# Patient Record
Sex: Female | Born: 1945
Health system: Southern US, Community
[De-identification: ages and names within clinical notes are randomized; demographics above are authoritative.]

## PROBLEM LIST (undated history)

## (undated) DIAGNOSIS — E119 Type 2 diabetes mellitus without complications: Secondary | ICD-10-CM

## (undated) DIAGNOSIS — R569 Unspecified convulsions: Secondary | ICD-10-CM

## (undated) DIAGNOSIS — F419 Anxiety disorder, unspecified: Secondary | ICD-10-CM

## (undated) DIAGNOSIS — R251 Tremor, unspecified: Secondary | ICD-10-CM

## (undated) DIAGNOSIS — F319 Bipolar disorder, unspecified: Secondary | ICD-10-CM

## (undated) DIAGNOSIS — I10 Essential (primary) hypertension: Secondary | ICD-10-CM

## (undated) DIAGNOSIS — E7800 Pure hypercholesterolemia, unspecified: Secondary | ICD-10-CM

## (undated) HISTORY — DX: Unspecified convulsions: R56.9

## (undated) HISTORY — DX: Essential (primary) hypertension: I10

## (undated) HISTORY — DX: Anxiety disorder, unspecified: F41.9

## (undated) HISTORY — DX: Tremor, unspecified: R25.1

## (undated) HISTORY — DX: Type 2 diabetes mellitus without complications: E11.9

## (undated) HISTORY — DX: Bipolar disorder, unspecified: F31.9

## (undated) HISTORY — DX: Pure hypercholesterolemia, unspecified: E78.00

---

## 2005-03-06 ENCOUNTER — Ambulatory Visit: Payer: Self-pay | Admitting: Physical Medicine & Rehabilitation

## 2005-03-06 ENCOUNTER — Inpatient Hospital Stay (HOSPITAL_COMMUNITY): Admission: RE | Admit: 2005-03-06 | Discharge: 2005-03-14 | Payer: Self-pay | Admitting: Neurosurgery

## 2005-12-10 IMAGING — CR DG CHEST 2V
2 series · 2 of 2 positions shown · non-contrast
Comparison: None.

CLINICAL DATA: 59 year old with meningioma.  History of seizures.  Productive cough.
 CHEST - 2 VIEWS:

[view not recorded (1 of 2)]
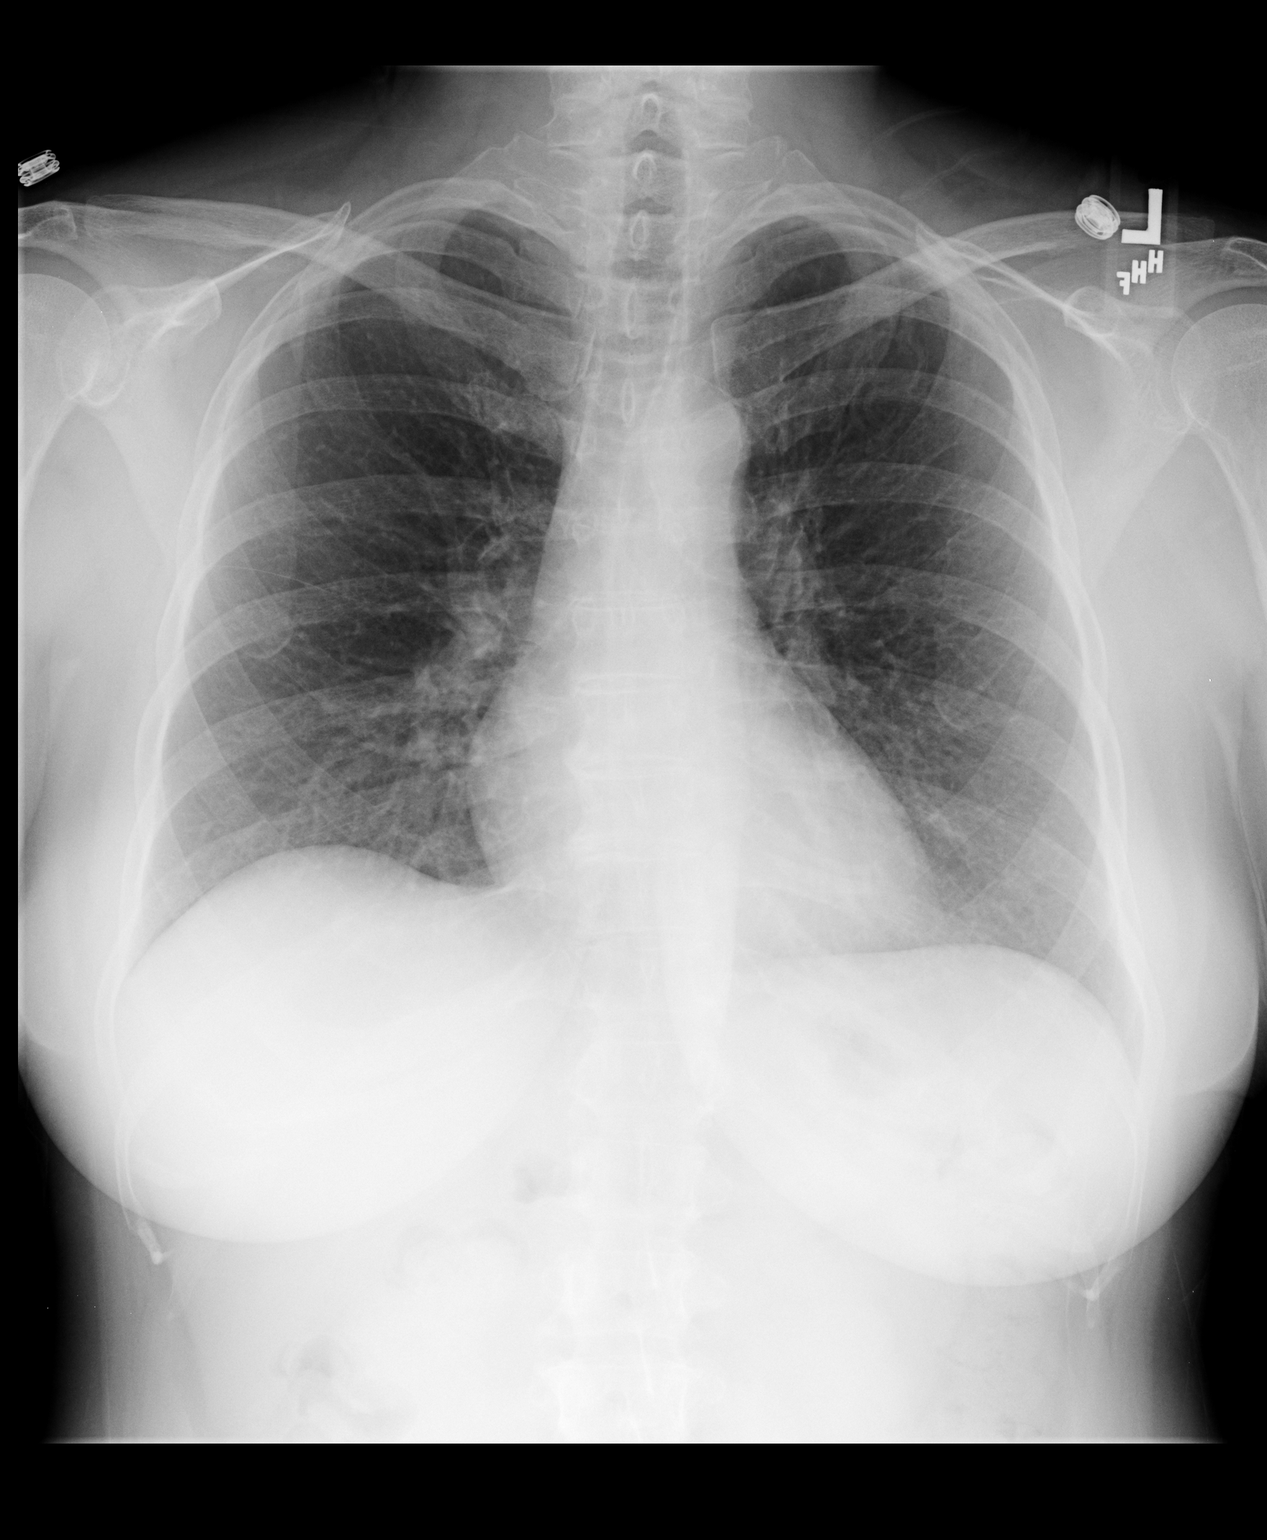

[view not recorded (2 of 2)]
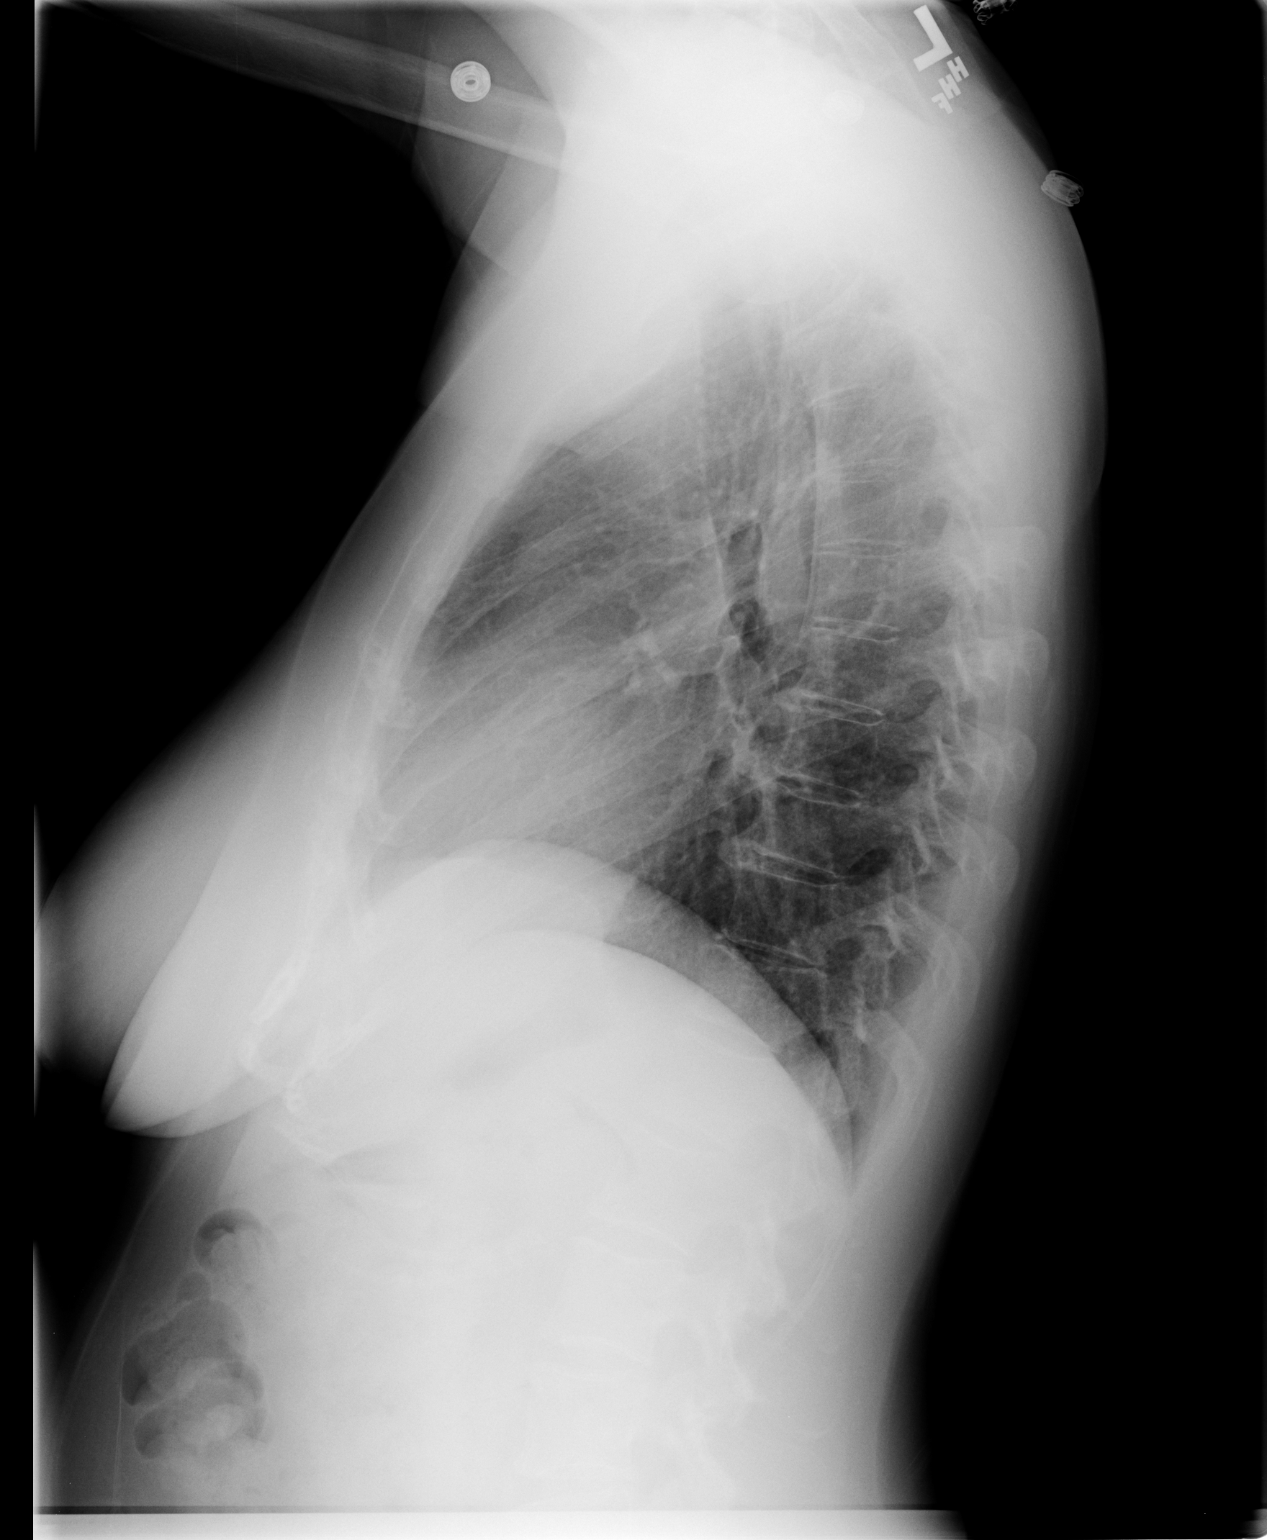

[2 of 2 positions shown; findings below may reference images not displayed]

FINDINGS: The heart is normal in size.  The lungs are free of focal consolidations and pleural effusions.  There is mild prominence of the interstitial markings, a nonspecific finding.  No evidence for pulmonary edema however.
IMPRESSION: Minimal prominence of interstitial markings.

## 2005-12-14 IMAGING — CT CT HEAD W/O CM
1 of 2 series · 13 of 30 positions shown, 17 images · IV contrast (agent unspecified)
Comparison: None.

CLINICAL DATA: Meningioma.
 HEAD CT WITHOUT CONTRAST:
TECHNIQUE: Contiguous axial images were obtained from the base of the skull through the vertex according to standard protocol without contrast.

[Series 2: brain · axial · 0.49mm/px · z∈[+126,+246]mm · 13 of 28 slices shown, 17 images]
[im 2/28  brain]
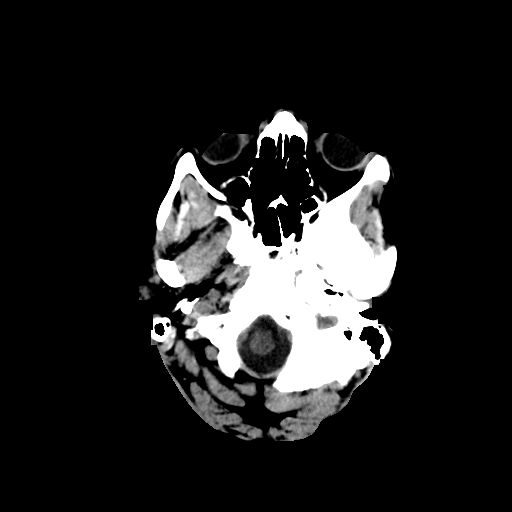
[im 2/28  bone]
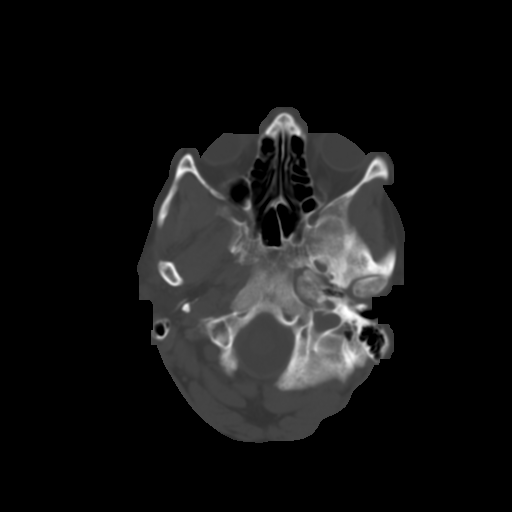
[im 4/28  brain]
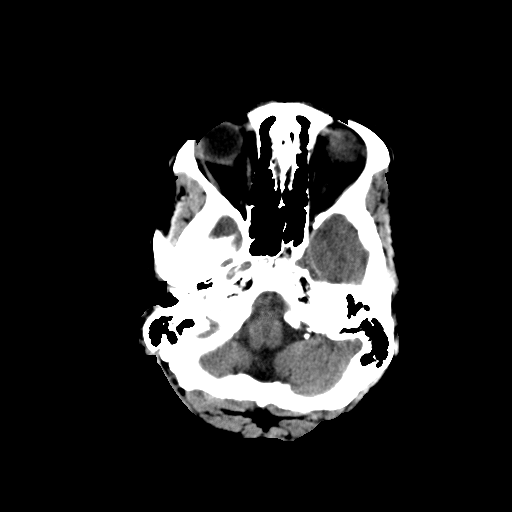
[im 6/28  brain]
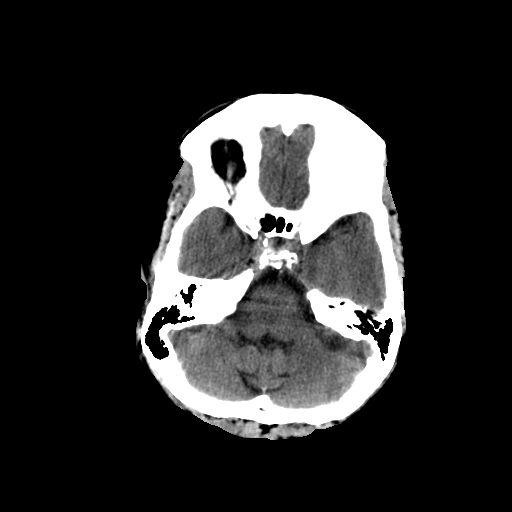
[im 8/28  brain]
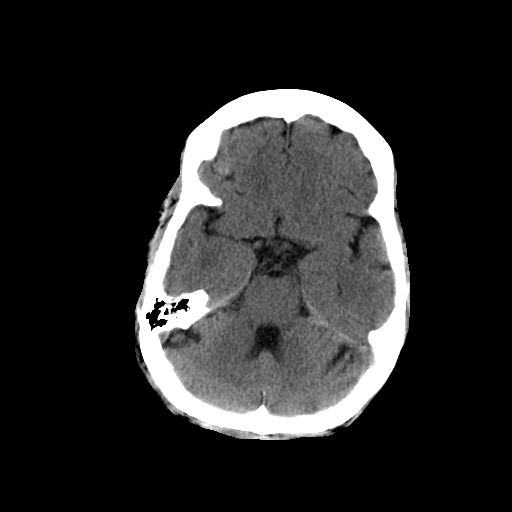
[im 10/28  brain]
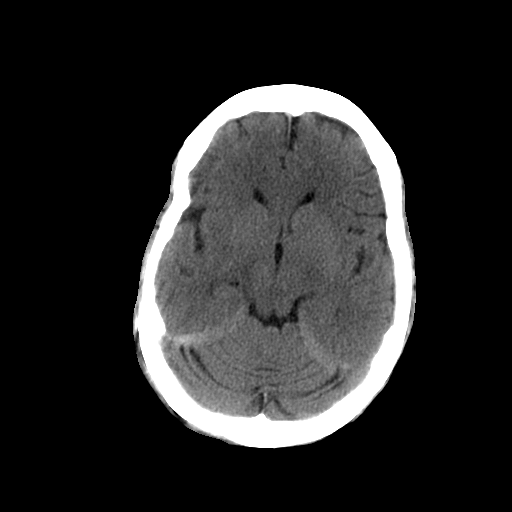
[im 10/28  bone]
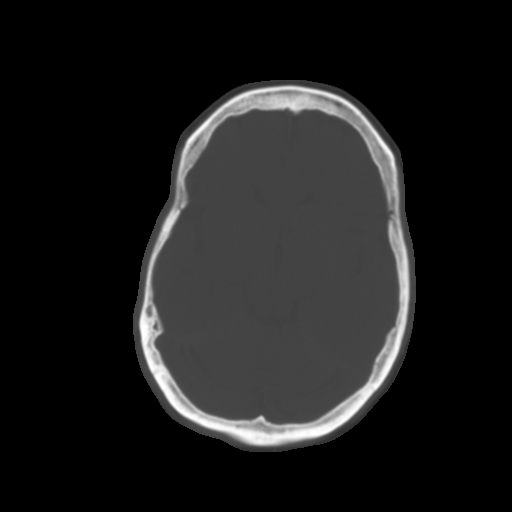
[im 12/28  brain]
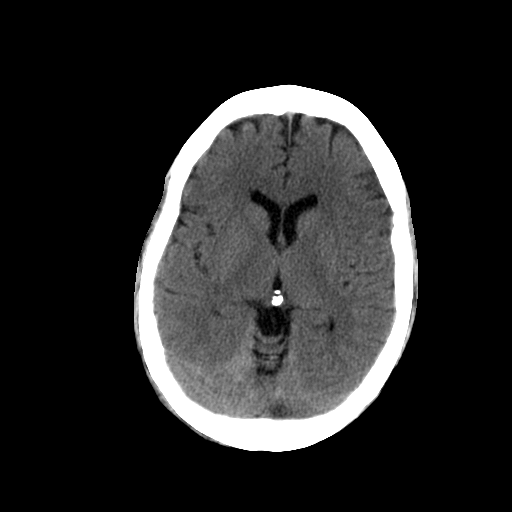
[im 14/28  brain]
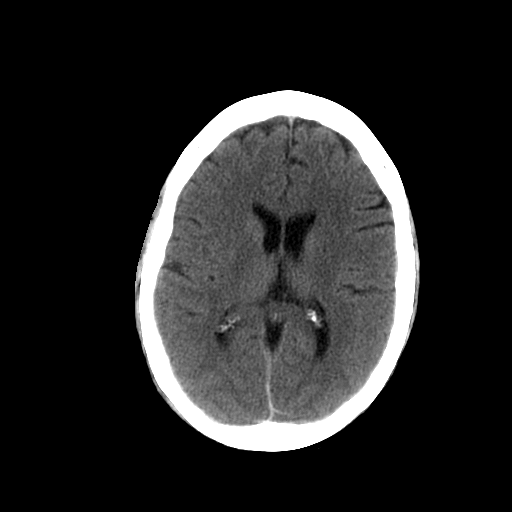
[im 16/28  brain]
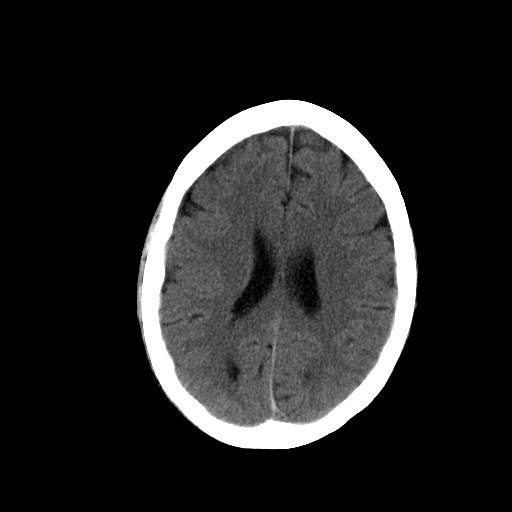
[im 18/28  brain]
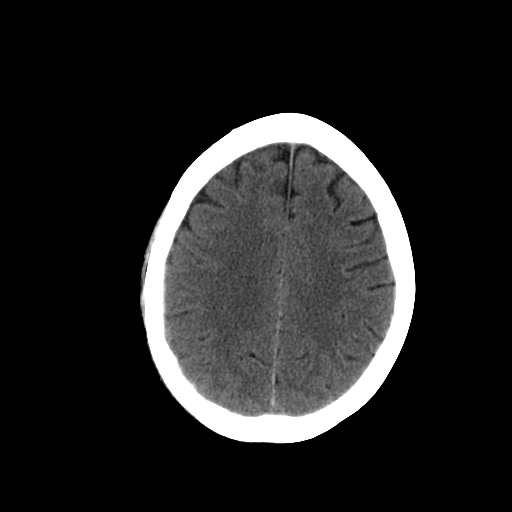
[im 18/28  bone]
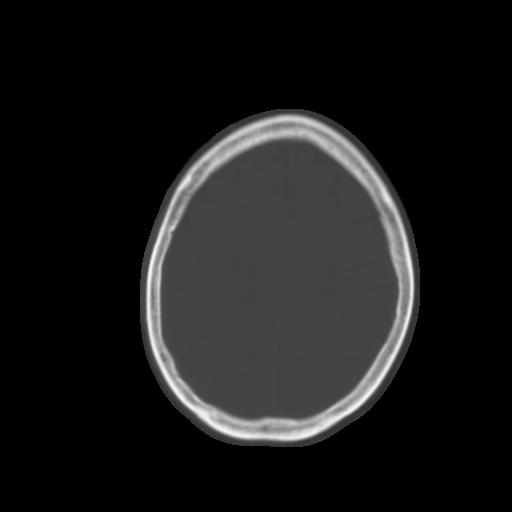
[im 20/28  brain]
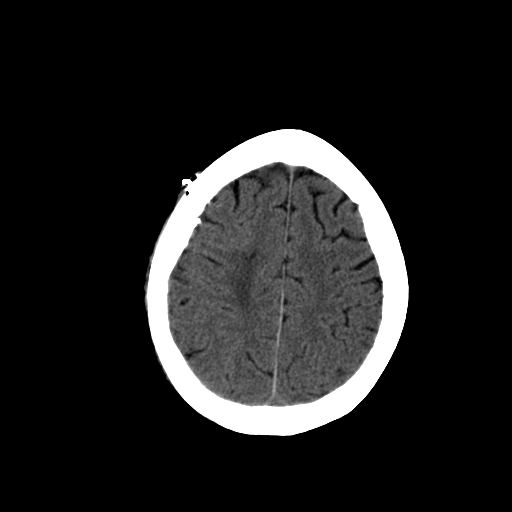
[im 22/28  brain]
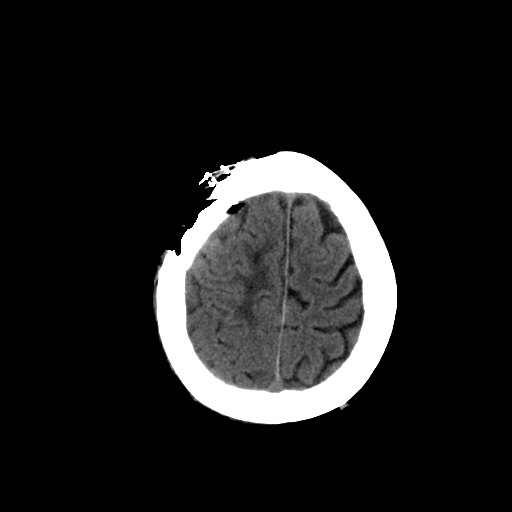
[im 24/28  brain]
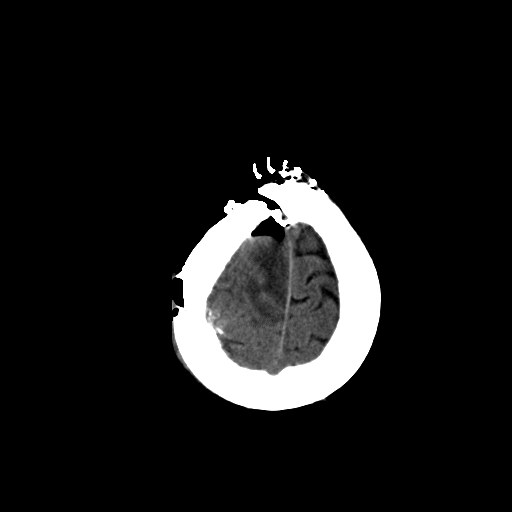
[im 26/28  brain]
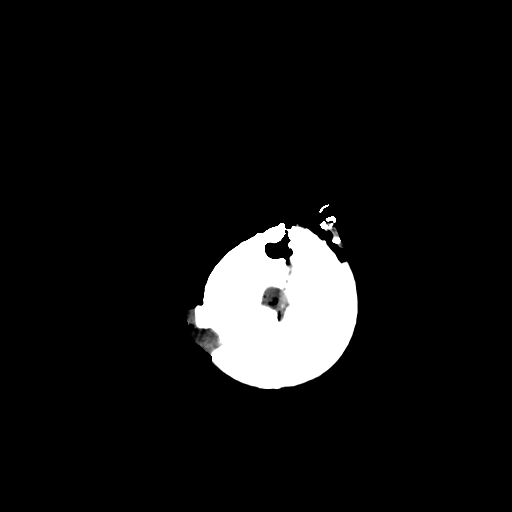
[im 26/28  bone]
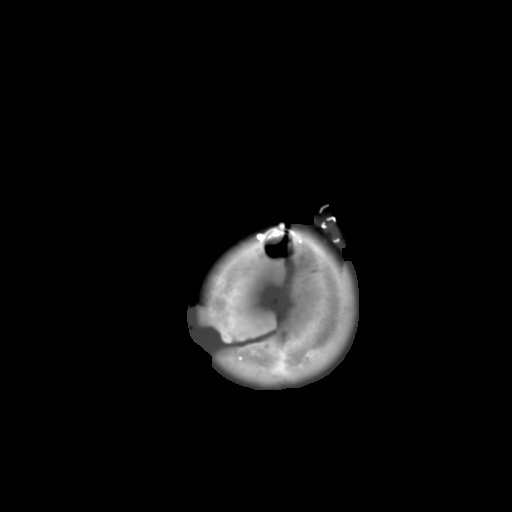

[13 of 30 positions shown; findings below may reference images not displayed]

FINDINGS: A high right frontoparietal craniotomy is noted.   A small amount of gas is present in the extraaxial space overlying the high right frontal lobe.   A small amount of vasogenic edema is present in the high right frontal lobe.   There is no evidence of hemorrhage in the surgical bed.   A small amount of hemorrhage may be present layering on the right side of the tentorium.   The ventricular system is within normal limits.   The mastoid air cells and visualized paranasal sinuses are clear.
IMPRESSION: Right frontoparietal craniotomy with a small amount of vasogenic edema in the adjacent right frontal lobe.   Small amount of blood layering on the right side of the tentorium is suspected.

## 2006-02-06 ENCOUNTER — Encounter
Admission: RE | Admit: 2006-02-06 | Discharge: 2006-05-07 | Payer: Self-pay | Admitting: Physical Medicine & Rehabilitation

## 2006-02-06 ENCOUNTER — Ambulatory Visit: Payer: Self-pay | Admitting: Physical Medicine & Rehabilitation

## 2011-08-13 DIAGNOSIS — J301 Allergic rhinitis due to pollen: Secondary | ICD-10-CM | POA: Diagnosis not present

## 2011-11-11 DIAGNOSIS — C801 Malignant (primary) neoplasm, unspecified: Secondary | ICD-10-CM | POA: Diagnosis not present

## 2011-11-11 DIAGNOSIS — C713 Malignant neoplasm of parietal lobe: Secondary | ICD-10-CM | POA: Diagnosis not present

## 2011-11-11 DIAGNOSIS — C700 Malignant neoplasm of cerebral meninges: Secondary | ICD-10-CM | POA: Diagnosis not present

## 2011-11-11 DIAGNOSIS — D320 Benign neoplasm of cerebral meninges: Secondary | ICD-10-CM | POA: Diagnosis not present

## 2011-11-11 DIAGNOSIS — C7949 Secondary malignant neoplasm of other parts of nervous system: Secondary | ICD-10-CM | POA: Diagnosis not present

## 2011-11-11 DIAGNOSIS — C7931 Secondary malignant neoplasm of brain: Secondary | ICD-10-CM | POA: Diagnosis not present

## 2011-12-18 DIAGNOSIS — H6980 Other specified disorders of Eustachian tube, unspecified ear: Secondary | ICD-10-CM | POA: Diagnosis not present

## 2011-12-18 DIAGNOSIS — F411 Generalized anxiety disorder: Secondary | ICD-10-CM | POA: Diagnosis not present

## 2011-12-18 DIAGNOSIS — I10 Essential (primary) hypertension: Secondary | ICD-10-CM | POA: Diagnosis not present

## 2011-12-18 DIAGNOSIS — E782 Mixed hyperlipidemia: Secondary | ICD-10-CM | POA: Diagnosis not present

## 2011-12-25 DIAGNOSIS — I10 Essential (primary) hypertension: Secondary | ICD-10-CM | POA: Diagnosis not present

## 2011-12-25 DIAGNOSIS — E782 Mixed hyperlipidemia: Secondary | ICD-10-CM | POA: Diagnosis not present

## 2012-01-28 DIAGNOSIS — G40209 Localization-related (focal) (partial) symptomatic epilepsy and epileptic syndromes with complex partial seizures, not intractable, without status epilepticus: Secondary | ICD-10-CM | POA: Diagnosis not present

## 2012-01-28 DIAGNOSIS — D320 Benign neoplasm of cerebral meninges: Secondary | ICD-10-CM | POA: Diagnosis not present

## 2012-05-18 DIAGNOSIS — D320 Benign neoplasm of cerebral meninges: Secondary | ICD-10-CM | POA: Diagnosis not present

## 2012-05-18 DIAGNOSIS — Z09 Encounter for follow-up examination after completed treatment for conditions other than malignant neoplasm: Secondary | ICD-10-CM | POA: Diagnosis not present

## 2012-05-19 DIAGNOSIS — Z23 Encounter for immunization: Secondary | ICD-10-CM | POA: Diagnosis not present

## 2012-05-29 DIAGNOSIS — R209 Unspecified disturbances of skin sensation: Secondary | ICD-10-CM | POA: Diagnosis not present

## 2012-05-29 DIAGNOSIS — E782 Mixed hyperlipidemia: Secondary | ICD-10-CM | POA: Diagnosis not present

## 2012-05-30 DIAGNOSIS — S40019A Contusion of unspecified shoulder, initial encounter: Secondary | ICD-10-CM | POA: Diagnosis not present

## 2012-05-30 DIAGNOSIS — R296 Repeated falls: Secondary | ICD-10-CM | POA: Diagnosis not present

## 2012-06-11 DIAGNOSIS — C713 Malignant neoplasm of parietal lobe: Secondary | ICD-10-CM | POA: Diagnosis not present

## 2012-06-11 DIAGNOSIS — C700 Malignant neoplasm of cerebral meninges: Secondary | ICD-10-CM | POA: Diagnosis not present

## 2012-06-11 DIAGNOSIS — D320 Benign neoplasm of cerebral meninges: Secondary | ICD-10-CM | POA: Diagnosis not present

## 2012-06-11 DIAGNOSIS — Z79899 Other long term (current) drug therapy: Secondary | ICD-10-CM | POA: Diagnosis not present

## 2012-07-06 DIAGNOSIS — J209 Acute bronchitis, unspecified: Secondary | ICD-10-CM | POA: Diagnosis not present

## 2012-08-12 DIAGNOSIS — E782 Mixed hyperlipidemia: Secondary | ICD-10-CM | POA: Diagnosis not present

## 2012-08-12 DIAGNOSIS — M75100 Unspecified rotator cuff tear or rupture of unspecified shoulder, not specified as traumatic: Secondary | ICD-10-CM | POA: Diagnosis not present

## 2012-08-12 DIAGNOSIS — B354 Tinea corporis: Secondary | ICD-10-CM | POA: Diagnosis not present

## 2012-11-25 DIAGNOSIS — M25529 Pain in unspecified elbow: Secondary | ICD-10-CM | POA: Diagnosis not present

## 2012-11-25 DIAGNOSIS — M25569 Pain in unspecified knee: Secondary | ICD-10-CM | POA: Diagnosis not present

## 2012-11-30 DIAGNOSIS — M533 Sacrococcygeal disorders, not elsewhere classified: Secondary | ICD-10-CM | POA: Diagnosis not present

## 2012-11-30 DIAGNOSIS — M25529 Pain in unspecified elbow: Secondary | ICD-10-CM | POA: Diagnosis not present

## 2012-12-07 DIAGNOSIS — M533 Sacrococcygeal disorders, not elsewhere classified: Secondary | ICD-10-CM | POA: Diagnosis not present

## 2012-12-08 DIAGNOSIS — M7710 Lateral epicondylitis, unspecified elbow: Secondary | ICD-10-CM | POA: Diagnosis not present

## 2012-12-09 DIAGNOSIS — M533 Sacrococcygeal disorders, not elsewhere classified: Secondary | ICD-10-CM | POA: Diagnosis not present

## 2012-12-14 DIAGNOSIS — M533 Sacrococcygeal disorders, not elsewhere classified: Secondary | ICD-10-CM | POA: Diagnosis not present

## 2012-12-16 DIAGNOSIS — M533 Sacrococcygeal disorders, not elsewhere classified: Secondary | ICD-10-CM | POA: Diagnosis not present

## 2012-12-21 DIAGNOSIS — M533 Sacrococcygeal disorders, not elsewhere classified: Secondary | ICD-10-CM | POA: Diagnosis not present

## 2012-12-23 DIAGNOSIS — I10 Essential (primary) hypertension: Secondary | ICD-10-CM | POA: Diagnosis not present

## 2012-12-23 DIAGNOSIS — M533 Sacrococcygeal disorders, not elsewhere classified: Secondary | ICD-10-CM | POA: Diagnosis not present

## 2012-12-23 DIAGNOSIS — E782 Mixed hyperlipidemia: Secondary | ICD-10-CM | POA: Diagnosis not present

## 2012-12-29 DIAGNOSIS — D320 Benign neoplasm of cerebral meninges: Secondary | ICD-10-CM | POA: Diagnosis not present

## 2013-01-01 DIAGNOSIS — M533 Sacrococcygeal disorders, not elsewhere classified: Secondary | ICD-10-CM | POA: Diagnosis not present

## 2013-02-02 DIAGNOSIS — I10 Essential (primary) hypertension: Secondary | ICD-10-CM | POA: Diagnosis not present

## 2013-02-02 DIAGNOSIS — F329 Major depressive disorder, single episode, unspecified: Secondary | ICD-10-CM | POA: Diagnosis not present

## 2013-05-24 DIAGNOSIS — Z23 Encounter for immunization: Secondary | ICD-10-CM | POA: Diagnosis not present

## 2013-06-28 DIAGNOSIS — S0993XA Unspecified injury of face, initial encounter: Secondary | ICD-10-CM | POA: Diagnosis not present

## 2013-06-28 DIAGNOSIS — S0990XA Unspecified injury of head, initial encounter: Secondary | ICD-10-CM | POA: Diagnosis not present

## 2013-06-28 DIAGNOSIS — Z6827 Body mass index (BMI) 27.0-27.9, adult: Secondary | ICD-10-CM | POA: Diagnosis not present

## 2013-06-28 DIAGNOSIS — R55 Syncope and collapse: Secondary | ICD-10-CM | POA: Diagnosis not present

## 2013-06-28 DIAGNOSIS — S0083XA Contusion of other part of head, initial encounter: Secondary | ICD-10-CM | POA: Diagnosis not present

## 2013-06-28 DIAGNOSIS — Z1389 Encounter for screening for other disorder: Secondary | ICD-10-CM | POA: Diagnosis not present

## 2013-06-28 DIAGNOSIS — S0003XA Contusion of scalp, initial encounter: Secondary | ICD-10-CM | POA: Diagnosis not present

## 2013-06-29 DIAGNOSIS — R55 Syncope and collapse: Secondary | ICD-10-CM | POA: Diagnosis not present

## 2013-06-30 DIAGNOSIS — F3130 Bipolar disorder, current episode depressed, mild or moderate severity, unspecified: Secondary | ICD-10-CM | POA: Diagnosis not present

## 2013-06-30 DIAGNOSIS — R55 Syncope and collapse: Secondary | ICD-10-CM | POA: Diagnosis not present

## 2013-07-16 DIAGNOSIS — R55 Syncope and collapse: Secondary | ICD-10-CM | POA: Diagnosis not present

## 2013-07-21 DIAGNOSIS — Z23 Encounter for immunization: Secondary | ICD-10-CM | POA: Diagnosis not present

## 2013-07-21 DIAGNOSIS — I517 Cardiomegaly: Secondary | ICD-10-CM | POA: Diagnosis not present

## 2013-07-21 DIAGNOSIS — R55 Syncope and collapse: Secondary | ICD-10-CM | POA: Diagnosis not present

## 2013-07-21 DIAGNOSIS — F3130 Bipolar disorder, current episode depressed, mild or moderate severity, unspecified: Secondary | ICD-10-CM | POA: Diagnosis not present

## 2013-08-06 DIAGNOSIS — H524 Presbyopia: Secondary | ICD-10-CM | POA: Diagnosis not present

## 2013-08-06 DIAGNOSIS — H43819 Vitreous degeneration, unspecified eye: Secondary | ICD-10-CM | POA: Diagnosis not present

## 2013-08-06 DIAGNOSIS — H52229 Regular astigmatism, unspecified eye: Secondary | ICD-10-CM | POA: Diagnosis not present

## 2013-08-06 DIAGNOSIS — H5200 Hypermetropia, unspecified eye: Secondary | ICD-10-CM | POA: Diagnosis not present

## 2013-09-27 DIAGNOSIS — Z6827 Body mass index (BMI) 27.0-27.9, adult: Secondary | ICD-10-CM | POA: Diagnosis not present

## 2013-09-27 DIAGNOSIS — H6980 Other specified disorders of Eustachian tube, unspecified ear: Secondary | ICD-10-CM | POA: Diagnosis not present

## 2013-10-13 DIAGNOSIS — Z6828 Body mass index (BMI) 28.0-28.9, adult: Secondary | ICD-10-CM | POA: Diagnosis not present

## 2013-10-13 DIAGNOSIS — E782 Mixed hyperlipidemia: Secondary | ICD-10-CM | POA: Diagnosis not present

## 2013-10-13 DIAGNOSIS — I10 Essential (primary) hypertension: Secondary | ICD-10-CM | POA: Diagnosis not present

## 2013-10-26 DIAGNOSIS — Z09 Encounter for follow-up examination after completed treatment for conditions other than malignant neoplasm: Secondary | ICD-10-CM | POA: Diagnosis not present

## 2013-10-26 DIAGNOSIS — D320 Benign neoplasm of cerebral meninges: Secondary | ICD-10-CM | POA: Diagnosis not present

## 2013-11-01 DIAGNOSIS — R7301 Impaired fasting glucose: Secondary | ICD-10-CM | POA: Diagnosis not present

## 2013-11-15 DIAGNOSIS — J0110 Acute frontal sinusitis, unspecified: Secondary | ICD-10-CM | POA: Diagnosis not present

## 2013-11-15 DIAGNOSIS — Z6827 Body mass index (BMI) 27.0-27.9, adult: Secondary | ICD-10-CM | POA: Diagnosis not present

## 2013-11-15 DIAGNOSIS — E119 Type 2 diabetes mellitus without complications: Secondary | ICD-10-CM | POA: Diagnosis not present

## 2013-11-23 DIAGNOSIS — Z01818 Encounter for other preprocedural examination: Secondary | ICD-10-CM | POA: Diagnosis not present

## 2013-11-23 DIAGNOSIS — I10 Essential (primary) hypertension: Secondary | ICD-10-CM | POA: Diagnosis present

## 2013-11-23 DIAGNOSIS — Z531 Procedure and treatment not carried out because of patient's decision for reasons of belief and group pressure: Secondary | ICD-10-CM | POA: Diagnosis not present

## 2013-11-23 DIAGNOSIS — Z472 Encounter for removal of internal fixation device: Secondary | ICD-10-CM | POA: Diagnosis not present

## 2013-11-23 DIAGNOSIS — D320 Benign neoplasm of cerebral meninges: Secondary | ICD-10-CM | POA: Diagnosis not present

## 2013-11-23 DIAGNOSIS — M129 Arthropathy, unspecified: Secondary | ICD-10-CM | POA: Diagnosis present

## 2013-11-23 DIAGNOSIS — G9389 Other specified disorders of brain: Secondary | ICD-10-CM | POA: Diagnosis not present

## 2013-11-23 DIAGNOSIS — G936 Cerebral edema: Secondary | ICD-10-CM | POA: Diagnosis not present

## 2013-11-23 DIAGNOSIS — R9431 Abnormal electrocardiogram [ECG] [EKG]: Secondary | ICD-10-CM | POA: Diagnosis not present

## 2013-11-23 DIAGNOSIS — K219 Gastro-esophageal reflux disease without esophagitis: Secondary | ICD-10-CM | POA: Diagnosis present

## 2013-11-23 DIAGNOSIS — F411 Generalized anxiety disorder: Secondary | ICD-10-CM | POA: Diagnosis present

## 2013-11-23 DIAGNOSIS — E119 Type 2 diabetes mellitus without complications: Secondary | ICD-10-CM | POA: Diagnosis present

## 2013-11-23 DIAGNOSIS — Z86011 Personal history of benign neoplasm of the brain: Secondary | ICD-10-CM | POA: Diagnosis not present

## 2013-11-23 DIAGNOSIS — E7800 Pure hypercholesterolemia, unspecified: Secondary | ICD-10-CM | POA: Diagnosis present

## 2013-12-07 DIAGNOSIS — D320 Benign neoplasm of cerebral meninges: Secondary | ICD-10-CM | POA: Diagnosis not present

## 2013-12-07 DIAGNOSIS — Z9889 Other specified postprocedural states: Secondary | ICD-10-CM | POA: Diagnosis not present

## 2013-12-07 DIAGNOSIS — Z4802 Encounter for removal of sutures: Secondary | ICD-10-CM | POA: Diagnosis not present

## 2013-12-13 DIAGNOSIS — E119 Type 2 diabetes mellitus without complications: Secondary | ICD-10-CM | POA: Diagnosis not present

## 2013-12-20 DIAGNOSIS — Z1389 Encounter for screening for other disorder: Secondary | ICD-10-CM | POA: Diagnosis not present

## 2013-12-20 DIAGNOSIS — Z1331 Encounter for screening for depression: Secondary | ICD-10-CM | POA: Diagnosis not present

## 2013-12-20 DIAGNOSIS — Z0000 Encounter for general adult medical examination without abnormal findings: Secondary | ICD-10-CM | POA: Diagnosis not present

## 2013-12-20 DIAGNOSIS — Z139 Encounter for screening, unspecified: Secondary | ICD-10-CM | POA: Diagnosis not present

## 2014-01-10 DIAGNOSIS — D320 Benign neoplasm of cerebral meninges: Secondary | ICD-10-CM | POA: Diagnosis not present

## 2014-02-09 DIAGNOSIS — E782 Mixed hyperlipidemia: Secondary | ICD-10-CM | POA: Diagnosis not present

## 2014-02-09 DIAGNOSIS — B351 Tinea unguium: Secondary | ICD-10-CM | POA: Diagnosis not present

## 2014-02-09 DIAGNOSIS — E119 Type 2 diabetes mellitus without complications: Secondary | ICD-10-CM | POA: Diagnosis not present

## 2014-02-09 DIAGNOSIS — I10 Essential (primary) hypertension: Secondary | ICD-10-CM | POA: Diagnosis not present

## 2014-02-18 DIAGNOSIS — N182 Chronic kidney disease, stage 2 (mild): Secondary | ICD-10-CM | POA: Diagnosis not present

## 2014-02-18 DIAGNOSIS — I129 Hypertensive chronic kidney disease with stage 1 through stage 4 chronic kidney disease, or unspecified chronic kidney disease: Secondary | ICD-10-CM | POA: Diagnosis not present

## 2014-02-18 DIAGNOSIS — E119 Type 2 diabetes mellitus without complications: Secondary | ICD-10-CM | POA: Diagnosis not present

## 2014-02-18 DIAGNOSIS — E782 Mixed hyperlipidemia: Secondary | ICD-10-CM | POA: Diagnosis not present

## 2014-02-23 DIAGNOSIS — L600 Ingrowing nail: Secondary | ICD-10-CM | POA: Diagnosis not present

## 2014-02-23 DIAGNOSIS — B351 Tinea unguium: Secondary | ICD-10-CM | POA: Diagnosis not present

## 2014-05-19 DIAGNOSIS — I10 Essential (primary) hypertension: Secondary | ICD-10-CM | POA: Diagnosis not present

## 2014-05-19 DIAGNOSIS — M7711 Lateral epicondylitis, right elbow: Secondary | ICD-10-CM | POA: Diagnosis not present

## 2014-05-19 DIAGNOSIS — E785 Hyperlipidemia, unspecified: Secondary | ICD-10-CM | POA: Diagnosis not present

## 2014-05-19 DIAGNOSIS — Z23 Encounter for immunization: Secondary | ICD-10-CM | POA: Diagnosis not present

## 2014-05-30 DIAGNOSIS — L918 Other hypertrophic disorders of the skin: Secondary | ICD-10-CM | POA: Diagnosis not present

## 2014-06-13 DIAGNOSIS — D320 Benign neoplasm of cerebral meninges: Secondary | ICD-10-CM | POA: Diagnosis not present

## 2014-06-13 DIAGNOSIS — Z9889 Other specified postprocedural states: Secondary | ICD-10-CM | POA: Diagnosis not present

## 2014-06-20 DIAGNOSIS — F3181 Bipolar II disorder: Secondary | ICD-10-CM | POA: Diagnosis not present

## 2014-07-07 DIAGNOSIS — H903 Sensorineural hearing loss, bilateral: Secondary | ICD-10-CM | POA: Diagnosis not present

## 2014-07-07 DIAGNOSIS — H9190 Unspecified hearing loss, unspecified ear: Secondary | ICD-10-CM | POA: Diagnosis not present

## 2014-07-11 DIAGNOSIS — F3181 Bipolar II disorder: Secondary | ICD-10-CM | POA: Diagnosis not present

## 2014-07-20 DIAGNOSIS — H2513 Age-related nuclear cataract, bilateral: Secondary | ICD-10-CM | POA: Diagnosis not present

## 2014-07-20 DIAGNOSIS — E119 Type 2 diabetes mellitus without complications: Secondary | ICD-10-CM | POA: Diagnosis not present

## 2014-09-02 DIAGNOSIS — F3181 Bipolar II disorder: Secondary | ICD-10-CM | POA: Diagnosis not present

## 2014-09-21 DIAGNOSIS — F3181 Bipolar II disorder: Secondary | ICD-10-CM | POA: Diagnosis not present

## 2014-09-22 DIAGNOSIS — I129 Hypertensive chronic kidney disease with stage 1 through stage 4 chronic kidney disease, or unspecified chronic kidney disease: Secondary | ICD-10-CM | POA: Diagnosis not present

## 2014-09-22 DIAGNOSIS — E782 Mixed hyperlipidemia: Secondary | ICD-10-CM | POA: Diagnosis not present

## 2014-09-22 DIAGNOSIS — N182 Chronic kidney disease, stage 2 (mild): Secondary | ICD-10-CM | POA: Diagnosis not present

## 2014-09-29 DIAGNOSIS — I129 Hypertensive chronic kidney disease with stage 1 through stage 4 chronic kidney disease, or unspecified chronic kidney disease: Secondary | ICD-10-CM | POA: Diagnosis not present

## 2014-09-29 DIAGNOSIS — R7301 Impaired fasting glucose: Secondary | ICD-10-CM | POA: Diagnosis not present

## 2014-09-29 DIAGNOSIS — N182 Chronic kidney disease, stage 2 (mild): Secondary | ICD-10-CM | POA: Diagnosis not present

## 2014-09-29 DIAGNOSIS — E782 Mixed hyperlipidemia: Secondary | ICD-10-CM | POA: Diagnosis not present

## 2014-10-03 DIAGNOSIS — Z1231 Encounter for screening mammogram for malignant neoplasm of breast: Secondary | ICD-10-CM | POA: Diagnosis not present

## 2014-10-17 DIAGNOSIS — F3181 Bipolar II disorder: Secondary | ICD-10-CM | POA: Diagnosis not present

## 2014-11-14 DIAGNOSIS — J029 Acute pharyngitis, unspecified: Secondary | ICD-10-CM | POA: Diagnosis not present

## 2014-11-14 DIAGNOSIS — Z6828 Body mass index (BMI) 28.0-28.9, adult: Secondary | ICD-10-CM | POA: Diagnosis not present

## 2014-11-14 DIAGNOSIS — R05 Cough: Secondary | ICD-10-CM | POA: Diagnosis not present

## 2014-12-12 DIAGNOSIS — F3181 Bipolar II disorder: Secondary | ICD-10-CM | POA: Diagnosis not present

## 2014-12-21 DIAGNOSIS — R253 Fasciculation: Secondary | ICD-10-CM | POA: Diagnosis not present

## 2014-12-21 DIAGNOSIS — Z1329 Encounter for screening for other suspected endocrine disorder: Secondary | ICD-10-CM | POA: Diagnosis not present

## 2014-12-21 DIAGNOSIS — R42 Dizziness and giddiness: Secondary | ICD-10-CM | POA: Diagnosis not present

## 2014-12-21 DIAGNOSIS — M62830 Muscle spasm of back: Secondary | ICD-10-CM | POA: Diagnosis not present

## 2015-01-09 DIAGNOSIS — F3181 Bipolar II disorder: Secondary | ICD-10-CM | POA: Diagnosis not present

## 2015-01-31 DIAGNOSIS — J302 Other seasonal allergic rhinitis: Secondary | ICD-10-CM | POA: Diagnosis not present

## 2015-01-31 DIAGNOSIS — Z6827 Body mass index (BMI) 27.0-27.9, adult: Secondary | ICD-10-CM | POA: Diagnosis not present

## 2015-01-31 DIAGNOSIS — M62838 Other muscle spasm: Secondary | ICD-10-CM | POA: Diagnosis not present

## 2015-04-27 DIAGNOSIS — E782 Mixed hyperlipidemia: Secondary | ICD-10-CM | POA: Diagnosis not present

## 2015-04-27 DIAGNOSIS — Z0000 Encounter for general adult medical examination without abnormal findings: Secondary | ICD-10-CM | POA: Diagnosis not present

## 2015-04-27 DIAGNOSIS — Z139 Encounter for screening, unspecified: Secondary | ICD-10-CM | POA: Diagnosis not present

## 2015-04-27 DIAGNOSIS — R7301 Impaired fasting glucose: Secondary | ICD-10-CM | POA: Diagnosis not present

## 2015-04-27 DIAGNOSIS — I129 Hypertensive chronic kidney disease with stage 1 through stage 4 chronic kidney disease, or unspecified chronic kidney disease: Secondary | ICD-10-CM | POA: Diagnosis not present

## 2015-04-27 DIAGNOSIS — Z9181 History of falling: Secondary | ICD-10-CM | POA: Diagnosis not present

## 2015-04-27 DIAGNOSIS — Z1389 Encounter for screening for other disorder: Secondary | ICD-10-CM | POA: Diagnosis not present

## 2015-05-01 DIAGNOSIS — Z8603 Personal history of neoplasm of uncertain behavior: Secondary | ICD-10-CM | POA: Diagnosis not present

## 2015-05-01 DIAGNOSIS — Z09 Encounter for follow-up examination after completed treatment for conditions other than malignant neoplasm: Secondary | ICD-10-CM | POA: Diagnosis not present

## 2015-05-01 DIAGNOSIS — D420 Neoplasm of uncertain behavior of cerebral meninges: Secondary | ICD-10-CM | POA: Diagnosis not present

## 2015-05-01 DIAGNOSIS — D320 Benign neoplasm of cerebral meninges: Secondary | ICD-10-CM | POA: Diagnosis not present

## 2015-05-01 DIAGNOSIS — Z9889 Other specified postprocedural states: Secondary | ICD-10-CM | POA: Diagnosis not present

## 2015-05-01 DIAGNOSIS — G9389 Other specified disorders of brain: Secondary | ICD-10-CM | POA: Diagnosis not present

## 2015-05-04 DIAGNOSIS — E782 Mixed hyperlipidemia: Secondary | ICD-10-CM | POA: Diagnosis not present

## 2015-05-04 DIAGNOSIS — E119 Type 2 diabetes mellitus without complications: Secondary | ICD-10-CM | POA: Diagnosis not present

## 2015-05-04 DIAGNOSIS — N182 Chronic kidney disease, stage 2 (mild): Secondary | ICD-10-CM | POA: Diagnosis not present

## 2015-05-04 DIAGNOSIS — I129 Hypertensive chronic kidney disease with stage 1 through stage 4 chronic kidney disease, or unspecified chronic kidney disease: Secondary | ICD-10-CM | POA: Diagnosis not present

## 2015-06-19 DIAGNOSIS — Z23 Encounter for immunization: Secondary | ICD-10-CM | POA: Diagnosis not present

## 2015-06-20 DIAGNOSIS — Z209 Contact with and (suspected) exposure to unspecified communicable disease: Secondary | ICD-10-CM | POA: Diagnosis not present

## 2015-06-20 DIAGNOSIS — R768 Other specified abnormal immunological findings in serum: Secondary | ICD-10-CM | POA: Diagnosis not present

## 2015-06-20 DIAGNOSIS — Z6828 Body mass index (BMI) 28.0-28.9, adult: Secondary | ICD-10-CM | POA: Diagnosis not present

## 2015-06-21 DIAGNOSIS — F3181 Bipolar II disorder: Secondary | ICD-10-CM | POA: Diagnosis not present

## 2015-06-26 DIAGNOSIS — R079 Chest pain, unspecified: Secondary | ICD-10-CM | POA: Diagnosis not present

## 2015-06-26 DIAGNOSIS — R9431 Abnormal electrocardiogram [ECG] [EKG]: Secondary | ICD-10-CM | POA: Diagnosis not present

## 2015-06-26 DIAGNOSIS — R55 Syncope and collapse: Secondary | ICD-10-CM | POA: Diagnosis not present

## 2015-06-26 DIAGNOSIS — R768 Other specified abnormal immunological findings in serum: Secondary | ICD-10-CM | POA: Diagnosis not present

## 2015-06-28 DIAGNOSIS — F329 Major depressive disorder, single episode, unspecified: Secondary | ICD-10-CM | POA: Diagnosis present

## 2015-06-28 DIAGNOSIS — I160 Hypertensive urgency: Secondary | ICD-10-CM | POA: Diagnosis not present

## 2015-06-28 DIAGNOSIS — Z888 Allergy status to other drugs, medicaments and biological substances status: Secondary | ICD-10-CM | POA: Diagnosis not present

## 2015-06-28 DIAGNOSIS — Z79899 Other long term (current) drug therapy: Secondary | ICD-10-CM | POA: Diagnosis not present

## 2015-06-28 DIAGNOSIS — E7800 Pure hypercholesterolemia, unspecified: Secondary | ICD-10-CM | POA: Diagnosis present

## 2015-06-28 DIAGNOSIS — R079 Chest pain, unspecified: Secondary | ICD-10-CM | POA: Diagnosis not present

## 2015-06-28 DIAGNOSIS — R072 Precordial pain: Secondary | ICD-10-CM | POA: Diagnosis not present

## 2015-06-28 DIAGNOSIS — E785 Hyperlipidemia, unspecified: Secondary | ICD-10-CM | POA: Diagnosis not present

## 2015-06-28 DIAGNOSIS — R569 Unspecified convulsions: Secondary | ICD-10-CM | POA: Diagnosis not present

## 2015-06-28 DIAGNOSIS — I249 Acute ischemic heart disease, unspecified: Secondary | ICD-10-CM | POA: Diagnosis not present

## 2015-06-28 DIAGNOSIS — R55 Syncope and collapse: Secondary | ICD-10-CM | POA: Diagnosis not present

## 2015-06-28 DIAGNOSIS — I10 Essential (primary) hypertension: Secondary | ICD-10-CM | POA: Diagnosis present

## 2015-06-28 DIAGNOSIS — Z882 Allergy status to sulfonamides status: Secondary | ICD-10-CM | POA: Diagnosis not present

## 2015-06-28 DIAGNOSIS — R9431 Abnormal electrocardiogram [ECG] [EKG]: Secondary | ICD-10-CM | POA: Diagnosis not present

## 2015-06-28 DIAGNOSIS — Z885 Allergy status to narcotic agent status: Secondary | ICD-10-CM | POA: Diagnosis not present

## 2015-06-28 DIAGNOSIS — R0602 Shortness of breath: Secondary | ICD-10-CM | POA: Diagnosis not present

## 2015-07-03 DIAGNOSIS — R079 Chest pain, unspecified: Secondary | ICD-10-CM | POA: Diagnosis not present

## 2015-07-03 DIAGNOSIS — J329 Chronic sinusitis, unspecified: Secondary | ICD-10-CM | POA: Diagnosis not present

## 2015-07-03 DIAGNOSIS — Z6827 Body mass index (BMI) 27.0-27.9, adult: Secondary | ICD-10-CM | POA: Diagnosis not present

## 2015-07-18 DIAGNOSIS — R55 Syncope and collapse: Secondary | ICD-10-CM | POA: Diagnosis not present

## 2015-07-21 ENCOUNTER — Telehealth: Payer: Self-pay | Admitting: Lab

## 2015-07-21 NOTE — Telephone Encounter (Signed)
Pt scheduled to have the required labs on 07/24/15 @ 300.  Pt will be given an appmt with Dr. Linus Salmons at that time

## 2015-07-24 ENCOUNTER — Other Ambulatory Visit: Payer: Medicare Other

## 2015-07-24 DIAGNOSIS — B182 Chronic viral hepatitis C: Secondary | ICD-10-CM

## 2015-07-24 LAB — COMPREHENSIVE METABOLIC PANEL
ALK PHOS: 75 U/L (ref 33–130)
ALT: 29 U/L (ref 6–29)
AST: 21 U/L (ref 10–35)
Albumin: 4.3 g/dL (ref 3.6–5.1)
BILIRUBIN TOTAL: 0.3 mg/dL (ref 0.2–1.2)
BUN: 18 mg/dL (ref 7–25)
CO2: 27 mmol/L (ref 20–31)
Calcium: 9.5 mg/dL (ref 8.6–10.4)
Chloride: 101 mmol/L (ref 98–110)
Creat: 0.79 mg/dL (ref 0.50–0.99)
Glucose, Bld: 182 mg/dL — ABNORMAL HIGH (ref 65–99)
POTASSIUM: 3.9 mmol/L (ref 3.5–5.3)
Sodium: 137 mmol/L (ref 135–146)
TOTAL PROTEIN: 6.7 g/dL (ref 6.1–8.1)

## 2015-07-24 LAB — CBC WITH DIFFERENTIAL/PLATELET
BASOS ABS: 0 10*3/uL (ref 0.0–0.1)
Basophils Relative: 0 % (ref 0–1)
EOS ABS: 0.2 10*3/uL (ref 0.0–0.7)
Eosinophils Relative: 3 % (ref 0–5)
HEMATOCRIT: 37.8 % (ref 36.0–46.0)
Hemoglobin: 12.4 g/dL (ref 12.0–15.0)
LYMPHS ABS: 1.9 10*3/uL (ref 0.7–4.0)
LYMPHS PCT: 26 % (ref 12–46)
MCH: 28.2 pg (ref 26.0–34.0)
MCHC: 32.8 g/dL (ref 30.0–36.0)
MCV: 86.1 fL (ref 78.0–100.0)
MPV: 8.9 fL (ref 8.6–12.4)
Monocytes Absolute: 0.6 10*3/uL (ref 0.1–1.0)
Monocytes Relative: 8 % (ref 3–12)
NEUTROS PCT: 63 % (ref 43–77)
Neutro Abs: 4.5 10*3/uL (ref 1.7–7.7)
PLATELETS: 372 10*3/uL (ref 150–400)
RBC: 4.39 MIL/uL (ref 3.87–5.11)
RDW: 13.4 % (ref 11.5–15.5)
WBC: 7.2 10*3/uL (ref 4.0–10.5)

## 2015-07-24 LAB — IRON: Iron: 88 ug/dL (ref 45–160)

## 2015-07-24 NOTE — Addendum Note (Signed)
Addended by: Dolan Amen D on: 07/24/2015 04:04 PM   Modules accepted: Orders

## 2015-07-25 DIAGNOSIS — Z6828 Body mass index (BMI) 28.0-28.9, adult: Secondary | ICD-10-CM | POA: Diagnosis not present

## 2015-07-25 DIAGNOSIS — H6593 Unspecified nonsuppurative otitis media, bilateral: Secondary | ICD-10-CM | POA: Diagnosis not present

## 2015-07-25 DIAGNOSIS — R05 Cough: Secondary | ICD-10-CM | POA: Diagnosis not present

## 2015-07-25 LAB — HEPATITIS B SURFACE ANTIGEN: Hepatitis B Surface Ag: NEGATIVE

## 2015-07-25 LAB — HEPATITIS B SURFACE ANTIBODY,QUALITATIVE: Hep B S Ab: NEGATIVE

## 2015-07-25 LAB — PROTIME-INR
INR: 1 (ref <1.50)
PROTHROMBIN TIME: 13.3 s (ref 11.6–15.2)

## 2015-07-25 LAB — ANA: Anti Nuclear Antibody(ANA): NEGATIVE

## 2015-07-25 LAB — HEPATITIS A ANTIBODY, TOTAL: HEP A TOTAL AB: NONREACTIVE

## 2015-07-25 LAB — HEPATITIS B CORE ANTIBODY, TOTAL: Hep B Core Total Ab: NONREACTIVE

## 2015-07-25 LAB — HIV ANTIBODY (ROUTINE TESTING W REFLEX): HIV 1&2 Ab, 4th Generation: NONREACTIVE

## 2015-07-25 NOTE — Telephone Encounter (Signed)
Pt had required labs done on 12/19 and was given an appmt to see Dr. Linus Salmons on 08/09/2015 @ 845am-LM for PCP ciontact-Damaris

## 2015-07-27 ENCOUNTER — Telehealth: Payer: Self-pay | Admitting: *Deleted

## 2015-07-27 LAB — HCV RNA, QUANT REAL-TIME PCR W/REFLEX

## 2015-07-27 NOTE — Telephone Encounter (Signed)
Left patient a voice mail with this information and appt cancelled. Kaitlin Bell

## 2015-07-27 NOTE — Telephone Encounter (Signed)
-----   Message from Thayer Headings, MD sent at 07/27/2015 12:27 PM EST ----- She is hepatitis C negative so her appt can be canceled in January.  thanks

## 2015-08-08 DIAGNOSIS — E782 Mixed hyperlipidemia: Secondary | ICD-10-CM | POA: Diagnosis not present

## 2015-08-08 DIAGNOSIS — R7301 Impaired fasting glucose: Secondary | ICD-10-CM | POA: Diagnosis not present

## 2015-08-09 ENCOUNTER — Encounter: Payer: Self-pay | Admitting: Internal Medicine

## 2015-08-10 DIAGNOSIS — B182 Chronic viral hepatitis C: Secondary | ICD-10-CM | POA: Diagnosis not present

## 2015-08-10 DIAGNOSIS — R55 Syncope and collapse: Secondary | ICD-10-CM | POA: Diagnosis not present

## 2015-08-10 DIAGNOSIS — R0789 Other chest pain: Secondary | ICD-10-CM | POA: Diagnosis not present

## 2015-08-18 DIAGNOSIS — R55 Syncope and collapse: Secondary | ICD-10-CM | POA: Diagnosis not present

## 2015-08-22 DIAGNOSIS — Z6828 Body mass index (BMI) 28.0-28.9, adult: Secondary | ICD-10-CM | POA: Diagnosis not present

## 2015-08-22 DIAGNOSIS — L918 Other hypertrophic disorders of the skin: Secondary | ICD-10-CM | POA: Diagnosis not present

## 2015-08-28 DIAGNOSIS — R55 Syncope and collapse: Secondary | ICD-10-CM | POA: Diagnosis not present

## 2015-09-07 DIAGNOSIS — B182 Chronic viral hepatitis C: Secondary | ICD-10-CM | POA: Diagnosis not present

## 2015-09-07 DIAGNOSIS — R55 Syncope and collapse: Secondary | ICD-10-CM | POA: Diagnosis not present

## 2015-09-07 DIAGNOSIS — R0789 Other chest pain: Secondary | ICD-10-CM | POA: Diagnosis not present

## 2015-09-07 DIAGNOSIS — R1010 Upper abdominal pain, unspecified: Secondary | ICD-10-CM | POA: Diagnosis not present

## 2015-09-11 DIAGNOSIS — F3181 Bipolar II disorder: Secondary | ICD-10-CM | POA: Diagnosis not present

## 2015-09-18 DIAGNOSIS — L821 Other seborrheic keratosis: Secondary | ICD-10-CM | POA: Diagnosis not present

## 2015-09-18 DIAGNOSIS — L578 Other skin changes due to chronic exposure to nonionizing radiation: Secondary | ICD-10-CM | POA: Diagnosis not present

## 2015-09-18 DIAGNOSIS — L918 Other hypertrophic disorders of the skin: Secondary | ICD-10-CM | POA: Diagnosis not present

## 2015-09-23 DIAGNOSIS — E7800 Pure hypercholesterolemia, unspecified: Secondary | ICD-10-CM | POA: Diagnosis present

## 2015-09-23 DIAGNOSIS — N179 Acute kidney failure, unspecified: Secondary | ICD-10-CM | POA: Diagnosis not present

## 2015-09-23 DIAGNOSIS — N189 Chronic kidney disease, unspecified: Secondary | ICD-10-CM | POA: Diagnosis present

## 2015-09-23 DIAGNOSIS — R1011 Right upper quadrant pain: Secondary | ICD-10-CM | POA: Diagnosis not present

## 2015-09-23 DIAGNOSIS — I129 Hypertensive chronic kidney disease with stage 1 through stage 4 chronic kidney disease, or unspecified chronic kidney disease: Secondary | ICD-10-CM | POA: Diagnosis not present

## 2015-09-23 DIAGNOSIS — R079 Chest pain, unspecified: Secondary | ICD-10-CM | POA: Diagnosis not present

## 2015-09-23 DIAGNOSIS — K851 Biliary acute pancreatitis without necrosis or infection: Secondary | ICD-10-CM | POA: Diagnosis not present

## 2015-09-23 DIAGNOSIS — K8591 Acute pancreatitis with uninfected necrosis, unspecified: Secondary | ICD-10-CM | POA: Diagnosis not present

## 2015-09-23 DIAGNOSIS — E876 Hypokalemia: Secondary | ICD-10-CM | POA: Diagnosis not present

## 2015-09-23 DIAGNOSIS — K859 Acute pancreatitis without necrosis or infection, unspecified: Secondary | ICD-10-CM | POA: Diagnosis not present

## 2015-09-23 DIAGNOSIS — K8010 Calculus of gallbladder with chronic cholecystitis without obstruction: Secondary | ICD-10-CM | POA: Diagnosis not present

## 2015-09-23 DIAGNOSIS — R1013 Epigastric pain: Secondary | ICD-10-CM | POA: Diagnosis not present

## 2015-09-23 DIAGNOSIS — I10 Essential (primary) hypertension: Secondary | ICD-10-CM | POA: Diagnosis not present

## 2015-09-23 DIAGNOSIS — K8020 Calculus of gallbladder without cholecystitis without obstruction: Secondary | ICD-10-CM | POA: Diagnosis not present

## 2015-09-23 DIAGNOSIS — I2510 Atherosclerotic heart disease of native coronary artery without angina pectoris: Secondary | ICD-10-CM | POA: Diagnosis not present

## 2015-09-23 DIAGNOSIS — E860 Dehydration: Secondary | ICD-10-CM | POA: Diagnosis not present

## 2015-09-23 DIAGNOSIS — I959 Hypotension, unspecified: Secondary | ICD-10-CM | POA: Diagnosis not present

## 2015-09-23 DIAGNOSIS — Z79899 Other long term (current) drug therapy: Secondary | ICD-10-CM | POA: Diagnosis not present

## 2015-09-23 DIAGNOSIS — K810 Acute cholecystitis: Secondary | ICD-10-CM | POA: Diagnosis not present

## 2015-09-23 DIAGNOSIS — G40909 Epilepsy, unspecified, not intractable, without status epilepticus: Secondary | ICD-10-CM | POA: Diagnosis not present

## 2015-09-27 ENCOUNTER — Other Ambulatory Visit: Payer: Self-pay | Admitting: Pathology

## 2015-10-04 DIAGNOSIS — K8020 Calculus of gallbladder without cholecystitis without obstruction: Secondary | ICD-10-CM | POA: Diagnosis not present

## 2015-10-04 DIAGNOSIS — Z9049 Acquired absence of other specified parts of digestive tract: Secondary | ICD-10-CM | POA: Diagnosis not present

## 2015-10-04 DIAGNOSIS — N179 Acute kidney failure, unspecified: Secondary | ICD-10-CM | POA: Diagnosis not present

## 2015-10-04 DIAGNOSIS — K859 Acute pancreatitis without necrosis or infection, unspecified: Secondary | ICD-10-CM | POA: Diagnosis not present

## 2015-11-08 DIAGNOSIS — M160 Bilateral primary osteoarthritis of hip: Secondary | ICD-10-CM | POA: Diagnosis not present

## 2015-11-08 DIAGNOSIS — M25531 Pain in right wrist: Secondary | ICD-10-CM | POA: Diagnosis not present

## 2015-11-08 DIAGNOSIS — M25559 Pain in unspecified hip: Secondary | ICD-10-CM | POA: Diagnosis not present

## 2015-11-08 DIAGNOSIS — M47816 Spondylosis without myelopathy or radiculopathy, lumbar region: Secondary | ICD-10-CM | POA: Diagnosis not present

## 2015-11-08 DIAGNOSIS — M79641 Pain in right hand: Secondary | ICD-10-CM | POA: Diagnosis not present

## 2015-11-08 DIAGNOSIS — S62231A Other displaced fracture of base of first metacarpal bone, right hand, initial encounter for closed fracture: Secondary | ICD-10-CM | POA: Diagnosis not present

## 2015-11-21 DIAGNOSIS — M1811 Unilateral primary osteoarthritis of first carpometacarpal joint, right hand: Secondary | ICD-10-CM | POA: Diagnosis not present

## 2015-11-21 DIAGNOSIS — L299 Pruritus, unspecified: Secondary | ICD-10-CM | POA: Diagnosis not present

## 2015-11-21 DIAGNOSIS — M25531 Pain in right wrist: Secondary | ICD-10-CM | POA: Diagnosis not present

## 2015-11-21 DIAGNOSIS — M25539 Pain in unspecified wrist: Secondary | ICD-10-CM | POA: Diagnosis not present

## 2015-11-21 DIAGNOSIS — Z6826 Body mass index (BMI) 26.0-26.9, adult: Secondary | ICD-10-CM | POA: Diagnosis not present

## 2015-11-21 DIAGNOSIS — M79641 Pain in right hand: Secondary | ICD-10-CM | POA: Diagnosis not present

## 2015-12-14 DIAGNOSIS — M79641 Pain in right hand: Secondary | ICD-10-CM | POA: Diagnosis not present

## 2015-12-14 DIAGNOSIS — M1811 Unilateral primary osteoarthritis of first carpometacarpal joint, right hand: Secondary | ICD-10-CM | POA: Diagnosis not present

## 2015-12-14 DIAGNOSIS — M6281 Muscle weakness (generalized): Secondary | ICD-10-CM | POA: Diagnosis not present

## 2015-12-14 DIAGNOSIS — M25641 Stiffness of right hand, not elsewhere classified: Secondary | ICD-10-CM | POA: Diagnosis not present

## 2015-12-19 DIAGNOSIS — M25641 Stiffness of right hand, not elsewhere classified: Secondary | ICD-10-CM | POA: Diagnosis not present

## 2015-12-19 DIAGNOSIS — M6281 Muscle weakness (generalized): Secondary | ICD-10-CM | POA: Diagnosis not present

## 2015-12-19 DIAGNOSIS — M1811 Unilateral primary osteoarthritis of first carpometacarpal joint, right hand: Secondary | ICD-10-CM | POA: Diagnosis not present

## 2015-12-19 DIAGNOSIS — M79641 Pain in right hand: Secondary | ICD-10-CM | POA: Diagnosis not present

## 2015-12-20 DIAGNOSIS — H43811 Vitreous degeneration, right eye: Secondary | ICD-10-CM | POA: Diagnosis not present

## 2015-12-20 DIAGNOSIS — M79641 Pain in right hand: Secondary | ICD-10-CM | POA: Diagnosis not present

## 2015-12-20 DIAGNOSIS — H5203 Hypermetropia, bilateral: Secondary | ICD-10-CM | POA: Diagnosis not present

## 2015-12-20 DIAGNOSIS — M25641 Stiffness of right hand, not elsewhere classified: Secondary | ICD-10-CM | POA: Diagnosis not present

## 2015-12-20 DIAGNOSIS — H25813 Combined forms of age-related cataract, bilateral: Secondary | ICD-10-CM | POA: Diagnosis not present

## 2015-12-20 DIAGNOSIS — M6281 Muscle weakness (generalized): Secondary | ICD-10-CM | POA: Diagnosis not present

## 2015-12-20 DIAGNOSIS — H43812 Vitreous degeneration, left eye: Secondary | ICD-10-CM | POA: Diagnosis not present

## 2015-12-20 DIAGNOSIS — H43391 Other vitreous opacities, right eye: Secondary | ICD-10-CM | POA: Diagnosis not present

## 2015-12-20 DIAGNOSIS — H43392 Other vitreous opacities, left eye: Secondary | ICD-10-CM | POA: Diagnosis not present

## 2015-12-20 DIAGNOSIS — H524 Presbyopia: Secondary | ICD-10-CM | POA: Diagnosis not present

## 2015-12-20 DIAGNOSIS — H52223 Regular astigmatism, bilateral: Secondary | ICD-10-CM | POA: Diagnosis not present

## 2015-12-20 DIAGNOSIS — I10 Essential (primary) hypertension: Secondary | ICD-10-CM | POA: Diagnosis not present

## 2015-12-20 DIAGNOSIS — M1811 Unilateral primary osteoarthritis of first carpometacarpal joint, right hand: Secondary | ICD-10-CM | POA: Diagnosis not present

## 2015-12-25 DIAGNOSIS — M6281 Muscle weakness (generalized): Secondary | ICD-10-CM | POA: Diagnosis not present

## 2015-12-25 DIAGNOSIS — M25641 Stiffness of right hand, not elsewhere classified: Secondary | ICD-10-CM | POA: Diagnosis not present

## 2015-12-25 DIAGNOSIS — M1811 Unilateral primary osteoarthritis of first carpometacarpal joint, right hand: Secondary | ICD-10-CM | POA: Diagnosis not present

## 2015-12-25 DIAGNOSIS — M79641 Pain in right hand: Secondary | ICD-10-CM | POA: Diagnosis not present

## 2015-12-27 DIAGNOSIS — I129 Hypertensive chronic kidney disease with stage 1 through stage 4 chronic kidney disease, or unspecified chronic kidney disease: Secondary | ICD-10-CM | POA: Diagnosis not present

## 2015-12-27 DIAGNOSIS — E119 Type 2 diabetes mellitus without complications: Secondary | ICD-10-CM | POA: Diagnosis not present

## 2015-12-27 DIAGNOSIS — E782 Mixed hyperlipidemia: Secondary | ICD-10-CM | POA: Diagnosis not present

## 2015-12-27 DIAGNOSIS — N182 Chronic kidney disease, stage 2 (mild): Secondary | ICD-10-CM | POA: Diagnosis not present

## 2015-12-28 DIAGNOSIS — M25641 Stiffness of right hand, not elsewhere classified: Secondary | ICD-10-CM | POA: Diagnosis not present

## 2015-12-28 DIAGNOSIS — M79641 Pain in right hand: Secondary | ICD-10-CM | POA: Diagnosis not present

## 2015-12-28 DIAGNOSIS — M6281 Muscle weakness (generalized): Secondary | ICD-10-CM | POA: Diagnosis not present

## 2015-12-28 DIAGNOSIS — M1811 Unilateral primary osteoarthritis of first carpometacarpal joint, right hand: Secondary | ICD-10-CM | POA: Diagnosis not present

## 2016-01-02 DIAGNOSIS — M1811 Unilateral primary osteoarthritis of first carpometacarpal joint, right hand: Secondary | ICD-10-CM | POA: Diagnosis not present

## 2016-01-02 DIAGNOSIS — M25641 Stiffness of right hand, not elsewhere classified: Secondary | ICD-10-CM | POA: Diagnosis not present

## 2016-01-02 DIAGNOSIS — M6281 Muscle weakness (generalized): Secondary | ICD-10-CM | POA: Diagnosis not present

## 2016-01-02 DIAGNOSIS — M79641 Pain in right hand: Secondary | ICD-10-CM | POA: Diagnosis not present

## 2016-01-03 DIAGNOSIS — E782 Mixed hyperlipidemia: Secondary | ICD-10-CM | POA: Diagnosis not present

## 2016-01-03 DIAGNOSIS — I129 Hypertensive chronic kidney disease with stage 1 through stage 4 chronic kidney disease, or unspecified chronic kidney disease: Secondary | ICD-10-CM | POA: Diagnosis not present

## 2016-01-03 DIAGNOSIS — E119 Type 2 diabetes mellitus without complications: Secondary | ICD-10-CM | POA: Diagnosis not present

## 2016-01-10 DIAGNOSIS — E119 Type 2 diabetes mellitus without complications: Secondary | ICD-10-CM | POA: Diagnosis not present

## 2016-01-10 DIAGNOSIS — N182 Chronic kidney disease, stage 2 (mild): Secondary | ICD-10-CM | POA: Diagnosis not present

## 2016-01-10 DIAGNOSIS — I129 Hypertensive chronic kidney disease with stage 1 through stage 4 chronic kidney disease, or unspecified chronic kidney disease: Secondary | ICD-10-CM | POA: Diagnosis not present

## 2016-01-10 DIAGNOSIS — E782 Mixed hyperlipidemia: Secondary | ICD-10-CM | POA: Diagnosis not present

## 2016-01-17 DIAGNOSIS — J029 Acute pharyngitis, unspecified: Secondary | ICD-10-CM | POA: Diagnosis not present

## 2016-01-17 DIAGNOSIS — Z6826 Body mass index (BMI) 26.0-26.9, adult: Secondary | ICD-10-CM | POA: Diagnosis not present

## 2016-01-24 DIAGNOSIS — Z6826 Body mass index (BMI) 26.0-26.9, adult: Secondary | ICD-10-CM | POA: Diagnosis not present

## 2016-01-24 DIAGNOSIS — R05 Cough: Secondary | ICD-10-CM | POA: Diagnosis not present

## 2016-04-26 DIAGNOSIS — Z23 Encounter for immunization: Secondary | ICD-10-CM | POA: Diagnosis not present

## 2016-05-14 DIAGNOSIS — I129 Hypertensive chronic kidney disease with stage 1 through stage 4 chronic kidney disease, or unspecified chronic kidney disease: Secondary | ICD-10-CM | POA: Diagnosis not present

## 2016-05-14 DIAGNOSIS — E119 Type 2 diabetes mellitus without complications: Secondary | ICD-10-CM | POA: Diagnosis not present

## 2016-05-14 DIAGNOSIS — E782 Mixed hyperlipidemia: Secondary | ICD-10-CM | POA: Diagnosis not present

## 2016-05-20 DIAGNOSIS — Z8603 Personal history of neoplasm of uncertain behavior: Secondary | ICD-10-CM | POA: Diagnosis not present

## 2016-05-20 DIAGNOSIS — Z923 Personal history of irradiation: Secondary | ICD-10-CM | POA: Diagnosis not present

## 2016-05-20 DIAGNOSIS — G9389 Other specified disorders of brain: Secondary | ICD-10-CM | POA: Diagnosis not present

## 2016-05-20 DIAGNOSIS — D329 Benign neoplasm of meninges, unspecified: Secondary | ICD-10-CM | POA: Diagnosis not present

## 2016-05-20 DIAGNOSIS — Z9889 Other specified postprocedural states: Secondary | ICD-10-CM | POA: Diagnosis not present

## 2016-05-20 DIAGNOSIS — Z09 Encounter for follow-up examination after completed treatment for conditions other than malignant neoplasm: Secondary | ICD-10-CM | POA: Diagnosis not present

## 2016-05-22 DIAGNOSIS — E782 Mixed hyperlipidemia: Secondary | ICD-10-CM | POA: Diagnosis not present

## 2016-05-22 DIAGNOSIS — N182 Chronic kidney disease, stage 2 (mild): Secondary | ICD-10-CM | POA: Diagnosis not present

## 2016-05-22 DIAGNOSIS — R350 Frequency of micturition: Secondary | ICD-10-CM | POA: Diagnosis not present

## 2016-05-22 DIAGNOSIS — I129 Hypertensive chronic kidney disease with stage 1 through stage 4 chronic kidney disease, or unspecified chronic kidney disease: Secondary | ICD-10-CM | POA: Diagnosis not present

## 2016-05-22 DIAGNOSIS — E119 Type 2 diabetes mellitus without complications: Secondary | ICD-10-CM | POA: Diagnosis not present

## 2016-06-05 DIAGNOSIS — N952 Postmenopausal atrophic vaginitis: Secondary | ICD-10-CM | POA: Diagnosis not present

## 2016-06-05 DIAGNOSIS — R350 Frequency of micturition: Secondary | ICD-10-CM | POA: Diagnosis not present

## 2016-06-05 DIAGNOSIS — N393 Stress incontinence (female) (male): Secondary | ICD-10-CM | POA: Diagnosis not present

## 2016-06-05 DIAGNOSIS — R351 Nocturia: Secondary | ICD-10-CM | POA: Diagnosis not present

## 2016-07-01 DIAGNOSIS — H43821 Vitreomacular adhesion, right eye: Secondary | ICD-10-CM | POA: Diagnosis not present

## 2016-07-01 DIAGNOSIS — H524 Presbyopia: Secondary | ICD-10-CM | POA: Diagnosis not present

## 2016-07-01 DIAGNOSIS — H35341 Macular cyst, hole, or pseudohole, right eye: Secondary | ICD-10-CM | POA: Diagnosis not present

## 2016-07-01 DIAGNOSIS — H5203 Hypermetropia, bilateral: Secondary | ICD-10-CM | POA: Diagnosis not present

## 2016-07-01 DIAGNOSIS — H35371 Puckering of macula, right eye: Secondary | ICD-10-CM | POA: Diagnosis not present

## 2016-07-01 DIAGNOSIS — I10 Essential (primary) hypertension: Secondary | ICD-10-CM | POA: Diagnosis not present

## 2016-07-01 DIAGNOSIS — H52223 Regular astigmatism, bilateral: Secondary | ICD-10-CM | POA: Diagnosis not present

## 2016-08-02 DIAGNOSIS — M85852 Other specified disorders of bone density and structure, left thigh: Secondary | ICD-10-CM | POA: Diagnosis not present

## 2016-08-02 DIAGNOSIS — Z1231 Encounter for screening mammogram for malignant neoplasm of breast: Secondary | ICD-10-CM | POA: Diagnosis not present

## 2016-08-02 DIAGNOSIS — M8580 Other specified disorders of bone density and structure, unspecified site: Secondary | ICD-10-CM | POA: Diagnosis not present

## 2016-08-02 DIAGNOSIS — Z780 Asymptomatic menopausal state: Secondary | ICD-10-CM | POA: Diagnosis not present

## 2016-09-04 DIAGNOSIS — R05 Cough: Secondary | ICD-10-CM | POA: Diagnosis not present

## 2016-09-04 DIAGNOSIS — R110 Nausea: Secondary | ICD-10-CM | POA: Diagnosis not present

## 2016-09-04 DIAGNOSIS — Z6827 Body mass index (BMI) 27.0-27.9, adult: Secondary | ICD-10-CM | POA: Diagnosis not present

## 2016-12-16 DIAGNOSIS — M7711 Lateral epicondylitis, right elbow: Secondary | ICD-10-CM | POA: Diagnosis not present

## 2016-12-16 DIAGNOSIS — M62838 Other muscle spasm: Secondary | ICD-10-CM | POA: Diagnosis not present

## 2016-12-16 DIAGNOSIS — Z6826 Body mass index (BMI) 26.0-26.9, adult: Secondary | ICD-10-CM | POA: Diagnosis not present

## 2017-01-21 DIAGNOSIS — S4991XS Unspecified injury of right shoulder and upper arm, sequela: Secondary | ICD-10-CM | POA: Diagnosis not present

## 2017-01-21 DIAGNOSIS — M19011 Primary osteoarthritis, right shoulder: Secondary | ICD-10-CM | POA: Diagnosis not present

## 2017-01-21 DIAGNOSIS — S59901S Unspecified injury of right elbow, sequela: Secondary | ICD-10-CM | POA: Diagnosis not present

## 2017-01-21 DIAGNOSIS — I10 Essential (primary) hypertension: Secondary | ICD-10-CM | POA: Diagnosis not present

## 2017-01-21 DIAGNOSIS — M1611 Unilateral primary osteoarthritis, right hip: Secondary | ICD-10-CM | POA: Diagnosis not present

## 2017-01-21 DIAGNOSIS — F3160 Bipolar disorder, current episode mixed, unspecified: Secondary | ICD-10-CM | POA: Diagnosis not present

## 2017-01-21 DIAGNOSIS — L988 Other specified disorders of the skin and subcutaneous tissue: Secondary | ICD-10-CM | POA: Diagnosis not present

## 2017-01-21 DIAGNOSIS — R0602 Shortness of breath: Secondary | ICD-10-CM | POA: Diagnosis not present

## 2017-01-21 DIAGNOSIS — Z139 Encounter for screening, unspecified: Secondary | ICD-10-CM | POA: Diagnosis not present

## 2017-01-21 DIAGNOSIS — M79609 Pain in unspecified limb: Secondary | ICD-10-CM | POA: Diagnosis not present

## 2017-01-21 DIAGNOSIS — Z1389 Encounter for screening for other disorder: Secondary | ICD-10-CM | POA: Diagnosis not present

## 2017-01-21 DIAGNOSIS — M25521 Pain in right elbow: Secondary | ICD-10-CM | POA: Diagnosis not present

## 2017-01-28 DIAGNOSIS — H6691 Otitis media, unspecified, right ear: Secondary | ICD-10-CM | POA: Diagnosis not present

## 2017-01-28 DIAGNOSIS — Z6828 Body mass index (BMI) 28.0-28.9, adult: Secondary | ICD-10-CM | POA: Diagnosis not present

## 2017-01-28 DIAGNOSIS — M19011 Primary osteoarthritis, right shoulder: Secondary | ICD-10-CM | POA: Diagnosis not present

## 2017-01-28 DIAGNOSIS — M778 Other enthesopathies, not elsewhere classified: Secondary | ICD-10-CM | POA: Diagnosis not present

## 2017-02-06 DIAGNOSIS — M7711 Lateral epicondylitis, right elbow: Secondary | ICD-10-CM | POA: Diagnosis not present

## 2017-02-09 DIAGNOSIS — S060X0A Concussion without loss of consciousness, initial encounter: Secondary | ICD-10-CM | POA: Diagnosis not present

## 2017-02-09 DIAGNOSIS — S299XXA Unspecified injury of thorax, initial encounter: Secondary | ICD-10-CM | POA: Diagnosis not present

## 2017-02-09 DIAGNOSIS — R4182 Altered mental status, unspecified: Secondary | ICD-10-CM | POA: Diagnosis not present

## 2017-02-09 DIAGNOSIS — S61411A Laceration without foreign body of right hand, initial encounter: Secondary | ICD-10-CM | POA: Diagnosis not present

## 2017-02-12 DIAGNOSIS — B353 Tinea pedis: Secondary | ICD-10-CM | POA: Diagnosis not present

## 2017-02-12 DIAGNOSIS — B351 Tinea unguium: Secondary | ICD-10-CM | POA: Diagnosis not present

## 2017-02-12 DIAGNOSIS — L820 Inflamed seborrheic keratosis: Secondary | ICD-10-CM | POA: Diagnosis not present

## 2017-02-13 DIAGNOSIS — B351 Tinea unguium: Secondary | ICD-10-CM | POA: Diagnosis not present

## 2017-03-03 ENCOUNTER — Other Ambulatory Visit: Payer: Self-pay

## 2017-03-06 DIAGNOSIS — E119 Type 2 diabetes mellitus without complications: Secondary | ICD-10-CM | POA: Diagnosis not present

## 2017-03-06 DIAGNOSIS — I129 Hypertensive chronic kidney disease with stage 1 through stage 4 chronic kidney disease, or unspecified chronic kidney disease: Secondary | ICD-10-CM | POA: Diagnosis not present

## 2017-03-06 DIAGNOSIS — J302 Other seasonal allergic rhinitis: Secondary | ICD-10-CM | POA: Diagnosis not present

## 2017-03-06 DIAGNOSIS — J305 Allergic rhinitis due to food: Secondary | ICD-10-CM | POA: Diagnosis not present

## 2017-03-07 DIAGNOSIS — J305 Allergic rhinitis due to food: Secondary | ICD-10-CM | POA: Diagnosis not present

## 2017-03-07 DIAGNOSIS — J302 Other seasonal allergic rhinitis: Secondary | ICD-10-CM | POA: Diagnosis not present

## 2017-03-10 DIAGNOSIS — J305 Allergic rhinitis due to food: Secondary | ICD-10-CM | POA: Diagnosis not present

## 2017-03-10 DIAGNOSIS — J302 Other seasonal allergic rhinitis: Secondary | ICD-10-CM | POA: Diagnosis not present

## 2017-03-11 DIAGNOSIS — J305 Allergic rhinitis due to food: Secondary | ICD-10-CM | POA: Diagnosis not present

## 2017-03-11 DIAGNOSIS — J302 Other seasonal allergic rhinitis: Secondary | ICD-10-CM | POA: Diagnosis not present

## 2017-03-12 DIAGNOSIS — J302 Other seasonal allergic rhinitis: Secondary | ICD-10-CM | POA: Diagnosis not present

## 2017-03-12 DIAGNOSIS — J305 Allergic rhinitis due to food: Secondary | ICD-10-CM | POA: Diagnosis not present

## 2017-03-13 DIAGNOSIS — J302 Other seasonal allergic rhinitis: Secondary | ICD-10-CM | POA: Diagnosis not present

## 2017-03-13 DIAGNOSIS — J305 Allergic rhinitis due to food: Secondary | ICD-10-CM | POA: Diagnosis not present

## 2017-03-14 DIAGNOSIS — J305 Allergic rhinitis due to food: Secondary | ICD-10-CM | POA: Diagnosis not present

## 2017-03-14 DIAGNOSIS — J302 Other seasonal allergic rhinitis: Secondary | ICD-10-CM | POA: Diagnosis not present

## 2017-03-17 DIAGNOSIS — J305 Allergic rhinitis due to food: Secondary | ICD-10-CM | POA: Diagnosis not present

## 2017-03-17 DIAGNOSIS — J302 Other seasonal allergic rhinitis: Secondary | ICD-10-CM | POA: Diagnosis not present

## 2017-05-06 DIAGNOSIS — H5203 Hypermetropia, bilateral: Secondary | ICD-10-CM | POA: Diagnosis not present

## 2017-05-06 DIAGNOSIS — H35373 Puckering of macula, bilateral: Secondary | ICD-10-CM | POA: Diagnosis not present

## 2017-05-06 DIAGNOSIS — H25813 Combined forms of age-related cataract, bilateral: Secondary | ICD-10-CM | POA: Diagnosis not present

## 2017-05-06 DIAGNOSIS — H43813 Vitreous degeneration, bilateral: Secondary | ICD-10-CM | POA: Diagnosis not present

## 2017-05-06 DIAGNOSIS — H524 Presbyopia: Secondary | ICD-10-CM | POA: Diagnosis not present

## 2017-05-06 DIAGNOSIS — H52223 Regular astigmatism, bilateral: Secondary | ICD-10-CM | POA: Diagnosis not present

## 2017-05-06 DIAGNOSIS — H43313 Vitreous membranes and strands, bilateral: Secondary | ICD-10-CM | POA: Diagnosis not present

## 2017-05-06 DIAGNOSIS — H35341 Macular cyst, hole, or pseudohole, right eye: Secondary | ICD-10-CM | POA: Diagnosis not present

## 2017-05-19 DIAGNOSIS — Z23 Encounter for immunization: Secondary | ICD-10-CM | POA: Diagnosis not present

## 2017-07-07 DIAGNOSIS — M25521 Pain in right elbow: Secondary | ICD-10-CM | POA: Diagnosis not present

## 2017-07-23 DIAGNOSIS — M25521 Pain in right elbow: Secondary | ICD-10-CM | POA: Diagnosis not present

## 2017-07-25 DIAGNOSIS — M25521 Pain in right elbow: Secondary | ICD-10-CM | POA: Diagnosis not present

## 2017-08-06 DIAGNOSIS — J3089 Other allergic rhinitis: Secondary | ICD-10-CM | POA: Diagnosis not present

## 2017-08-06 DIAGNOSIS — Z6827 Body mass index (BMI) 27.0-27.9, adult: Secondary | ICD-10-CM | POA: Diagnosis not present

## 2017-08-14 DIAGNOSIS — Z9889 Other specified postprocedural states: Secondary | ICD-10-CM | POA: Diagnosis not present

## 2017-08-14 DIAGNOSIS — G9389 Other specified disorders of brain: Secondary | ICD-10-CM | POA: Diagnosis not present

## 2017-08-14 DIAGNOSIS — D420 Neoplasm of uncertain behavior of cerebral meninges: Secondary | ICD-10-CM | POA: Diagnosis not present

## 2017-09-10 DIAGNOSIS — Z139 Encounter for screening, unspecified: Secondary | ICD-10-CM | POA: Diagnosis not present

## 2017-09-10 DIAGNOSIS — Z1331 Encounter for screening for depression: Secondary | ICD-10-CM | POA: Diagnosis not present

## 2017-09-10 DIAGNOSIS — Z0000 Encounter for general adult medical examination without abnormal findings: Secondary | ICD-10-CM | POA: Diagnosis not present

## 2017-09-10 DIAGNOSIS — B351 Tinea unguium: Secondary | ICD-10-CM | POA: Diagnosis not present

## 2017-09-16 DIAGNOSIS — J3089 Other allergic rhinitis: Secondary | ICD-10-CM | POA: Diagnosis not present

## 2017-11-24 DIAGNOSIS — B351 Tinea unguium: Secondary | ICD-10-CM | POA: Diagnosis not present

## 2017-11-24 DIAGNOSIS — L638 Other alopecia areata: Secondary | ICD-10-CM | POA: Diagnosis not present

## 2017-11-24 DIAGNOSIS — L821 Other seborrheic keratosis: Secondary | ICD-10-CM | POA: Diagnosis not present

## 2017-12-02 DIAGNOSIS — J3089 Other allergic rhinitis: Secondary | ICD-10-CM | POA: Diagnosis not present

## 2017-12-09 DIAGNOSIS — J3089 Other allergic rhinitis: Secondary | ICD-10-CM | POA: Diagnosis not present

## 2017-12-12 DIAGNOSIS — Z79899 Other long term (current) drug therapy: Secondary | ICD-10-CM | POA: Diagnosis not present

## 2017-12-12 DIAGNOSIS — G890 Central pain syndrome: Secondary | ICD-10-CM | POA: Diagnosis not present

## 2017-12-16 DIAGNOSIS — J3089 Other allergic rhinitis: Secondary | ICD-10-CM | POA: Diagnosis not present

## 2017-12-23 DIAGNOSIS — J3089 Other allergic rhinitis: Secondary | ICD-10-CM | POA: Diagnosis not present

## 2018-01-06 DIAGNOSIS — J3089 Other allergic rhinitis: Secondary | ICD-10-CM | POA: Diagnosis not present

## 2018-01-12 DIAGNOSIS — F411 Generalized anxiety disorder: Secondary | ICD-10-CM | POA: Diagnosis not present

## 2018-01-12 DIAGNOSIS — I10 Essential (primary) hypertension: Secondary | ICD-10-CM | POA: Diagnosis not present

## 2018-01-12 DIAGNOSIS — M76891 Other specified enthesopathies of right lower limb, excluding foot: Secondary | ICD-10-CM | POA: Diagnosis not present

## 2018-01-12 DIAGNOSIS — E1159 Type 2 diabetes mellitus with other circulatory complications: Secondary | ICD-10-CM | POA: Diagnosis not present

## 2018-01-14 DIAGNOSIS — J3089 Other allergic rhinitis: Secondary | ICD-10-CM | POA: Diagnosis not present

## 2018-01-29 DIAGNOSIS — J3089 Other allergic rhinitis: Secondary | ICD-10-CM | POA: Diagnosis not present

## 2018-02-04 DIAGNOSIS — J3089 Other allergic rhinitis: Secondary | ICD-10-CM | POA: Diagnosis not present

## 2018-02-09 DIAGNOSIS — L600 Ingrowing nail: Secondary | ICD-10-CM | POA: Diagnosis not present

## 2018-02-09 DIAGNOSIS — L03032 Cellulitis of left toe: Secondary | ICD-10-CM | POA: Diagnosis not present

## 2018-02-11 DIAGNOSIS — I129 Hypertensive chronic kidney disease with stage 1 through stage 4 chronic kidney disease, or unspecified chronic kidney disease: Secondary | ICD-10-CM | POA: Diagnosis not present

## 2018-02-11 DIAGNOSIS — J3089 Other allergic rhinitis: Secondary | ICD-10-CM | POA: Diagnosis not present

## 2018-02-11 DIAGNOSIS — E1369 Other specified diabetes mellitus with other specified complication: Secondary | ICD-10-CM | POA: Diagnosis not present

## 2018-02-11 DIAGNOSIS — E119 Type 2 diabetes mellitus without complications: Secondary | ICD-10-CM | POA: Diagnosis not present

## 2018-02-17 DIAGNOSIS — J3089 Other allergic rhinitis: Secondary | ICD-10-CM | POA: Diagnosis not present

## 2018-02-20 DIAGNOSIS — R0600 Dyspnea, unspecified: Secondary | ICD-10-CM | POA: Diagnosis not present

## 2018-02-20 DIAGNOSIS — Z139 Encounter for screening, unspecified: Secondary | ICD-10-CM | POA: Diagnosis not present

## 2018-02-20 DIAGNOSIS — I10 Essential (primary) hypertension: Secondary | ICD-10-CM | POA: Diagnosis not present

## 2018-02-20 DIAGNOSIS — Z1331 Encounter for screening for depression: Secondary | ICD-10-CM | POA: Diagnosis not present

## 2018-02-20 DIAGNOSIS — F319 Bipolar disorder, unspecified: Secondary | ICD-10-CM | POA: Diagnosis not present

## 2018-02-20 DIAGNOSIS — E1159 Type 2 diabetes mellitus with other circulatory complications: Secondary | ICD-10-CM | POA: Diagnosis not present

## 2018-02-23 DIAGNOSIS — L600 Ingrowing nail: Secondary | ICD-10-CM | POA: Diagnosis not present

## 2018-02-25 DIAGNOSIS — B351 Tinea unguium: Secondary | ICD-10-CM | POA: Diagnosis not present

## 2018-02-25 DIAGNOSIS — L820 Inflamed seborrheic keratosis: Secondary | ICD-10-CM | POA: Diagnosis not present

## 2018-02-25 DIAGNOSIS — L601 Onycholysis: Secondary | ICD-10-CM | POA: Diagnosis not present

## 2018-02-25 DIAGNOSIS — L821 Other seborrheic keratosis: Secondary | ICD-10-CM | POA: Diagnosis not present

## 2018-03-03 DIAGNOSIS — J3089 Other allergic rhinitis: Secondary | ICD-10-CM | POA: Diagnosis not present

## 2018-03-10 DIAGNOSIS — J3089 Other allergic rhinitis: Secondary | ICD-10-CM | POA: Diagnosis not present

## 2018-03-17 DIAGNOSIS — J3089 Other allergic rhinitis: Secondary | ICD-10-CM | POA: Diagnosis not present

## 2018-05-07 DIAGNOSIS — Z23 Encounter for immunization: Secondary | ICD-10-CM | POA: Diagnosis not present

## 2018-06-19 ENCOUNTER — Other Ambulatory Visit: Payer: Self-pay

## 2018-10-29 DIAGNOSIS — I129 Hypertensive chronic kidney disease with stage 1 through stage 4 chronic kidney disease, or unspecified chronic kidney disease: Secondary | ICD-10-CM | POA: Diagnosis not present

## 2018-10-29 DIAGNOSIS — E785 Hyperlipidemia, unspecified: Secondary | ICD-10-CM | POA: Diagnosis not present

## 2018-10-29 DIAGNOSIS — E119 Type 2 diabetes mellitus without complications: Secondary | ICD-10-CM | POA: Diagnosis not present

## 2018-11-05 DIAGNOSIS — E1122 Type 2 diabetes mellitus with diabetic chronic kidney disease: Secondary | ICD-10-CM | POA: Diagnosis not present

## 2018-11-05 DIAGNOSIS — N182 Chronic kidney disease, stage 2 (mild): Secondary | ICD-10-CM | POA: Diagnosis not present

## 2018-11-05 DIAGNOSIS — E785 Hyperlipidemia, unspecified: Secondary | ICD-10-CM | POA: Diagnosis not present

## 2018-11-05 DIAGNOSIS — I129 Hypertensive chronic kidney disease with stage 1 through stage 4 chronic kidney disease, or unspecified chronic kidney disease: Secondary | ICD-10-CM | POA: Diagnosis not present

## 2018-11-26 DIAGNOSIS — Z0000 Encounter for general adult medical examination without abnormal findings: Secondary | ICD-10-CM | POA: Diagnosis not present

## 2018-11-26 DIAGNOSIS — Z139 Encounter for screening, unspecified: Secondary | ICD-10-CM | POA: Diagnosis not present

## 2018-11-26 DIAGNOSIS — F319 Bipolar disorder, unspecified: Secondary | ICD-10-CM | POA: Diagnosis not present

## 2018-12-03 DIAGNOSIS — E1122 Type 2 diabetes mellitus with diabetic chronic kidney disease: Secondary | ICD-10-CM | POA: Diagnosis not present

## 2018-12-03 DIAGNOSIS — F319 Bipolar disorder, unspecified: Secondary | ICD-10-CM | POA: Diagnosis not present

## 2018-12-03 DIAGNOSIS — N182 Chronic kidney disease, stage 2 (mild): Secondary | ICD-10-CM | POA: Diagnosis not present

## 2018-12-03 DIAGNOSIS — E785 Hyperlipidemia, unspecified: Secondary | ICD-10-CM | POA: Diagnosis not present

## 2018-12-10 DIAGNOSIS — R2689 Other abnormalities of gait and mobility: Secondary | ICD-10-CM | POA: Diagnosis not present

## 2018-12-10 DIAGNOSIS — F319 Bipolar disorder, unspecified: Secondary | ICD-10-CM | POA: Diagnosis not present

## 2018-12-10 DIAGNOSIS — Z7189 Other specified counseling: Secondary | ICD-10-CM | POA: Diagnosis not present

## 2018-12-11 DIAGNOSIS — M545 Low back pain: Secondary | ICD-10-CM | POA: Diagnosis not present

## 2018-12-11 DIAGNOSIS — R262 Difficulty in walking, not elsewhere classified: Secondary | ICD-10-CM | POA: Diagnosis not present

## 2018-12-11 DIAGNOSIS — R296 Repeated falls: Secondary | ICD-10-CM | POA: Diagnosis not present

## 2018-12-11 DIAGNOSIS — R269 Unspecified abnormalities of gait and mobility: Secondary | ICD-10-CM | POA: Diagnosis not present

## 2018-12-11 DIAGNOSIS — R531 Weakness: Secondary | ICD-10-CM | POA: Diagnosis not present

## 2018-12-15 DIAGNOSIS — M545 Low back pain: Secondary | ICD-10-CM | POA: Diagnosis not present

## 2018-12-15 DIAGNOSIS — R296 Repeated falls: Secondary | ICD-10-CM | POA: Diagnosis not present

## 2018-12-15 DIAGNOSIS — R531 Weakness: Secondary | ICD-10-CM | POA: Diagnosis not present

## 2018-12-15 DIAGNOSIS — R262 Difficulty in walking, not elsewhere classified: Secondary | ICD-10-CM | POA: Diagnosis not present

## 2018-12-15 DIAGNOSIS — R269 Unspecified abnormalities of gait and mobility: Secondary | ICD-10-CM | POA: Diagnosis not present

## 2018-12-18 DIAGNOSIS — R296 Repeated falls: Secondary | ICD-10-CM | POA: Diagnosis not present

## 2018-12-18 DIAGNOSIS — M545 Low back pain: Secondary | ICD-10-CM | POA: Diagnosis not present

## 2018-12-18 DIAGNOSIS — R269 Unspecified abnormalities of gait and mobility: Secondary | ICD-10-CM | POA: Diagnosis not present

## 2018-12-18 DIAGNOSIS — R531 Weakness: Secondary | ICD-10-CM | POA: Diagnosis not present

## 2018-12-18 DIAGNOSIS — R262 Difficulty in walking, not elsewhere classified: Secondary | ICD-10-CM | POA: Diagnosis not present

## 2018-12-25 DIAGNOSIS — R531 Weakness: Secondary | ICD-10-CM | POA: Diagnosis not present

## 2018-12-25 DIAGNOSIS — M545 Low back pain: Secondary | ICD-10-CM | POA: Diagnosis not present

## 2018-12-25 DIAGNOSIS — R269 Unspecified abnormalities of gait and mobility: Secondary | ICD-10-CM | POA: Diagnosis not present

## 2018-12-25 DIAGNOSIS — R296 Repeated falls: Secondary | ICD-10-CM | POA: Diagnosis not present

## 2018-12-25 DIAGNOSIS — R262 Difficulty in walking, not elsewhere classified: Secondary | ICD-10-CM | POA: Diagnosis not present

## 2019-01-01 DIAGNOSIS — F319 Bipolar disorder, unspecified: Secondary | ICD-10-CM | POA: Diagnosis not present

## 2019-01-01 DIAGNOSIS — E1122 Type 2 diabetes mellitus with diabetic chronic kidney disease: Secondary | ICD-10-CM | POA: Diagnosis not present

## 2019-01-01 DIAGNOSIS — E785 Hyperlipidemia, unspecified: Secondary | ICD-10-CM | POA: Diagnosis not present

## 2019-01-01 DIAGNOSIS — N182 Chronic kidney disease, stage 2 (mild): Secondary | ICD-10-CM | POA: Diagnosis not present

## 2019-01-05 DIAGNOSIS — Z6827 Body mass index (BMI) 27.0-27.9, adult: Secondary | ICD-10-CM | POA: Diagnosis not present

## 2019-01-05 DIAGNOSIS — F319 Bipolar disorder, unspecified: Secondary | ICD-10-CM | POA: Diagnosis not present

## 2019-01-11 DIAGNOSIS — E1122 Type 2 diabetes mellitus with diabetic chronic kidney disease: Secondary | ICD-10-CM | POA: Diagnosis not present

## 2019-01-11 DIAGNOSIS — F319 Bipolar disorder, unspecified: Secondary | ICD-10-CM | POA: Diagnosis not present

## 2019-01-11 DIAGNOSIS — I129 Hypertensive chronic kidney disease with stage 1 through stage 4 chronic kidney disease, or unspecified chronic kidney disease: Secondary | ICD-10-CM | POA: Diagnosis not present

## 2019-01-11 DIAGNOSIS — Z6827 Body mass index (BMI) 27.0-27.9, adult: Secondary | ICD-10-CM | POA: Diagnosis not present

## 2019-01-29 DIAGNOSIS — F319 Bipolar disorder, unspecified: Secondary | ICD-10-CM | POA: Diagnosis not present

## 2019-01-29 DIAGNOSIS — Z6827 Body mass index (BMI) 27.0-27.9, adult: Secondary | ICD-10-CM | POA: Diagnosis not present

## 2019-01-29 DIAGNOSIS — M545 Low back pain: Secondary | ICD-10-CM | POA: Diagnosis not present

## 2019-02-02 DIAGNOSIS — N182 Chronic kidney disease, stage 2 (mild): Secondary | ICD-10-CM | POA: Diagnosis not present

## 2019-02-02 DIAGNOSIS — E1122 Type 2 diabetes mellitus with diabetic chronic kidney disease: Secondary | ICD-10-CM | POA: Diagnosis not present

## 2019-02-02 DIAGNOSIS — I129 Hypertensive chronic kidney disease with stage 1 through stage 4 chronic kidney disease, or unspecified chronic kidney disease: Secondary | ICD-10-CM | POA: Diagnosis not present

## 2019-02-02 DIAGNOSIS — F319 Bipolar disorder, unspecified: Secondary | ICD-10-CM | POA: Diagnosis not present

## 2019-02-11 DIAGNOSIS — E1122 Type 2 diabetes mellitus with diabetic chronic kidney disease: Secondary | ICD-10-CM | POA: Diagnosis not present

## 2019-02-11 DIAGNOSIS — E785 Hyperlipidemia, unspecified: Secondary | ICD-10-CM | POA: Diagnosis not present

## 2019-02-18 DIAGNOSIS — E785 Hyperlipidemia, unspecified: Secondary | ICD-10-CM | POA: Diagnosis not present

## 2019-02-18 DIAGNOSIS — I129 Hypertensive chronic kidney disease with stage 1 through stage 4 chronic kidney disease, or unspecified chronic kidney disease: Secondary | ICD-10-CM | POA: Diagnosis not present

## 2019-02-18 DIAGNOSIS — Z20828 Contact with and (suspected) exposure to other viral communicable diseases: Secondary | ICD-10-CM | POA: Diagnosis not present

## 2019-02-18 DIAGNOSIS — N182 Chronic kidney disease, stage 2 (mild): Secondary | ICD-10-CM | POA: Diagnosis not present

## 2019-02-18 DIAGNOSIS — E1122 Type 2 diabetes mellitus with diabetic chronic kidney disease: Secondary | ICD-10-CM | POA: Diagnosis not present

## 2019-02-22 DIAGNOSIS — E119 Type 2 diabetes mellitus without complications: Secondary | ICD-10-CM | POA: Diagnosis not present

## 2019-02-22 DIAGNOSIS — H2513 Age-related nuclear cataract, bilateral: Secondary | ICD-10-CM | POA: Diagnosis not present

## 2019-05-25 DIAGNOSIS — D420 Neoplasm of uncertain behavior of cerebral meninges: Secondary | ICD-10-CM | POA: Diagnosis not present

## 2019-05-31 DIAGNOSIS — Z23 Encounter for immunization: Secondary | ICD-10-CM | POA: Diagnosis not present

## 2019-08-26 DIAGNOSIS — D332 Benign neoplasm of brain, unspecified: Secondary | ICD-10-CM | POA: Diagnosis not present

## 2019-08-26 DIAGNOSIS — E785 Hyperlipidemia, unspecified: Secondary | ICD-10-CM | POA: Diagnosis not present

## 2019-08-26 DIAGNOSIS — I129 Hypertensive chronic kidney disease with stage 1 through stage 4 chronic kidney disease, or unspecified chronic kidney disease: Secondary | ICD-10-CM | POA: Diagnosis not present

## 2019-08-26 DIAGNOSIS — E1122 Type 2 diabetes mellitus with diabetic chronic kidney disease: Secondary | ICD-10-CM | POA: Diagnosis not present

## 2019-09-09 DIAGNOSIS — I129 Hypertensive chronic kidney disease with stage 1 through stage 4 chronic kidney disease, or unspecified chronic kidney disease: Secondary | ICD-10-CM | POA: Diagnosis not present

## 2019-09-09 DIAGNOSIS — E785 Hyperlipidemia, unspecified: Secondary | ICD-10-CM | POA: Diagnosis not present

## 2019-09-09 DIAGNOSIS — E1122 Type 2 diabetes mellitus with diabetic chronic kidney disease: Secondary | ICD-10-CM | POA: Diagnosis not present

## 2019-09-16 DIAGNOSIS — E1122 Type 2 diabetes mellitus with diabetic chronic kidney disease: Secondary | ICD-10-CM | POA: Diagnosis not present

## 2019-09-16 DIAGNOSIS — E785 Hyperlipidemia, unspecified: Secondary | ICD-10-CM | POA: Diagnosis not present

## 2019-09-16 DIAGNOSIS — E1159 Type 2 diabetes mellitus with other circulatory complications: Secondary | ICD-10-CM | POA: Diagnosis not present

## 2019-09-16 DIAGNOSIS — I129 Hypertensive chronic kidney disease with stage 1 through stage 4 chronic kidney disease, or unspecified chronic kidney disease: Secondary | ICD-10-CM | POA: Diagnosis not present

## 2019-10-14 DIAGNOSIS — Z139 Encounter for screening, unspecified: Secondary | ICD-10-CM | POA: Diagnosis not present

## 2019-10-14 DIAGNOSIS — F319 Bipolar disorder, unspecified: Secondary | ICD-10-CM | POA: Diagnosis not present

## 2019-10-14 DIAGNOSIS — E1165 Type 2 diabetes mellitus with hyperglycemia: Secondary | ICD-10-CM | POA: Diagnosis not present

## 2019-10-14 DIAGNOSIS — R011 Cardiac murmur, unspecified: Secondary | ICD-10-CM | POA: Diagnosis not present

## 2019-10-18 DIAGNOSIS — N182 Chronic kidney disease, stage 2 (mild): Secondary | ICD-10-CM | POA: Diagnosis not present

## 2019-10-18 DIAGNOSIS — E1122 Type 2 diabetes mellitus with diabetic chronic kidney disease: Secondary | ICD-10-CM | POA: Diagnosis not present

## 2019-10-18 DIAGNOSIS — I129 Hypertensive chronic kidney disease with stage 1 through stage 4 chronic kidney disease, or unspecified chronic kidney disease: Secondary | ICD-10-CM | POA: Diagnosis not present

## 2019-10-21 DIAGNOSIS — R011 Cardiac murmur, unspecified: Secondary | ICD-10-CM | POA: Diagnosis not present

## 2019-10-28 DIAGNOSIS — R011 Cardiac murmur, unspecified: Secondary | ICD-10-CM | POA: Diagnosis not present

## 2019-10-28 DIAGNOSIS — I129 Hypertensive chronic kidney disease with stage 1 through stage 4 chronic kidney disease, or unspecified chronic kidney disease: Secondary | ICD-10-CM | POA: Diagnosis not present

## 2019-10-28 DIAGNOSIS — Z6828 Body mass index (BMI) 28.0-28.9, adult: Secondary | ICD-10-CM | POA: Diagnosis not present

## 2019-10-28 DIAGNOSIS — E1122 Type 2 diabetes mellitus with diabetic chronic kidney disease: Secondary | ICD-10-CM | POA: Diagnosis not present

## 2019-11-01 DIAGNOSIS — E785 Hyperlipidemia, unspecified: Secondary | ICD-10-CM | POA: Diagnosis not present

## 2019-11-01 DIAGNOSIS — E1122 Type 2 diabetes mellitus with diabetic chronic kidney disease: Secondary | ICD-10-CM | POA: Diagnosis not present

## 2019-11-01 DIAGNOSIS — R011 Cardiac murmur, unspecified: Secondary | ICD-10-CM | POA: Diagnosis not present

## 2019-11-01 DIAGNOSIS — I129 Hypertensive chronic kidney disease with stage 1 through stage 4 chronic kidney disease, or unspecified chronic kidney disease: Secondary | ICD-10-CM | POA: Diagnosis not present

## 2019-11-02 DIAGNOSIS — I129 Hypertensive chronic kidney disease with stage 1 through stage 4 chronic kidney disease, or unspecified chronic kidney disease: Secondary | ICD-10-CM | POA: Diagnosis not present

## 2019-11-02 DIAGNOSIS — E1122 Type 2 diabetes mellitus with diabetic chronic kidney disease: Secondary | ICD-10-CM | POA: Diagnosis not present

## 2019-11-16 DIAGNOSIS — N182 Chronic kidney disease, stage 2 (mild): Secondary | ICD-10-CM | POA: Diagnosis not present

## 2019-11-16 DIAGNOSIS — E1122 Type 2 diabetes mellitus with diabetic chronic kidney disease: Secondary | ICD-10-CM | POA: Diagnosis not present

## 2019-11-16 DIAGNOSIS — I129 Hypertensive chronic kidney disease with stage 1 through stage 4 chronic kidney disease, or unspecified chronic kidney disease: Secondary | ICD-10-CM | POA: Diagnosis not present

## 2019-12-09 DIAGNOSIS — E1122 Type 2 diabetes mellitus with diabetic chronic kidney disease: Secondary | ICD-10-CM | POA: Diagnosis not present

## 2019-12-16 DIAGNOSIS — E1122 Type 2 diabetes mellitus with diabetic chronic kidney disease: Secondary | ICD-10-CM | POA: Diagnosis not present

## 2019-12-16 DIAGNOSIS — D429 Neoplasm of uncertain behavior of meninges, unspecified: Secondary | ICD-10-CM | POA: Diagnosis not present

## 2019-12-16 DIAGNOSIS — E1159 Type 2 diabetes mellitus with other circulatory complications: Secondary | ICD-10-CM | POA: Diagnosis not present

## 2019-12-16 DIAGNOSIS — Z7189 Other specified counseling: Secondary | ICD-10-CM | POA: Diagnosis not present

## 2019-12-16 DIAGNOSIS — Z0000 Encounter for general adult medical examination without abnormal findings: Secondary | ICD-10-CM | POA: Diagnosis not present

## 2019-12-16 DIAGNOSIS — Z1339 Encounter for screening examination for other mental health and behavioral disorders: Secondary | ICD-10-CM | POA: Diagnosis not present

## 2019-12-16 DIAGNOSIS — I152 Hypertension secondary to endocrine disorders: Secondary | ICD-10-CM | POA: Diagnosis not present

## 2019-12-16 DIAGNOSIS — Z139 Encounter for screening, unspecified: Secondary | ICD-10-CM | POA: Diagnosis not present

## 2019-12-16 DIAGNOSIS — Z136 Encounter for screening for cardiovascular disorders: Secondary | ICD-10-CM | POA: Diagnosis not present

## 2019-12-16 DIAGNOSIS — Z1331 Encounter for screening for depression: Secondary | ICD-10-CM | POA: Diagnosis not present

## 2019-12-29 DIAGNOSIS — I129 Hypertensive chronic kidney disease with stage 1 through stage 4 chronic kidney disease, or unspecified chronic kidney disease: Secondary | ICD-10-CM | POA: Diagnosis not present

## 2019-12-29 DIAGNOSIS — Z6828 Body mass index (BMI) 28.0-28.9, adult: Secondary | ICD-10-CM | POA: Diagnosis not present

## 2019-12-29 DIAGNOSIS — N182 Chronic kidney disease, stage 2 (mild): Secondary | ICD-10-CM | POA: Diagnosis not present

## 2019-12-29 DIAGNOSIS — M542 Cervicalgia: Secondary | ICD-10-CM | POA: Diagnosis not present

## 2020-03-25 DIAGNOSIS — M545 Low back pain: Secondary | ICD-10-CM | POA: Diagnosis not present

## 2020-04-04 DIAGNOSIS — M545 Low back pain: Secondary | ICD-10-CM | POA: Diagnosis not present

## 2020-04-12 DIAGNOSIS — M5136 Other intervertebral disc degeneration, lumbar region: Secondary | ICD-10-CM | POA: Diagnosis not present

## 2020-04-19 DIAGNOSIS — M545 Low back pain: Secondary | ICD-10-CM | POA: Diagnosis not present

## 2020-06-07 DIAGNOSIS — Z23 Encounter for immunization: Secondary | ICD-10-CM | POA: Diagnosis not present

## 2020-06-20 DIAGNOSIS — E1369 Other specified diabetes mellitus with other specified complication: Secondary | ICD-10-CM | POA: Diagnosis not present

## 2020-07-03 DIAGNOSIS — E1159 Type 2 diabetes mellitus with other circulatory complications: Secondary | ICD-10-CM | POA: Diagnosis not present

## 2020-07-03 DIAGNOSIS — E1122 Type 2 diabetes mellitus with diabetic chronic kidney disease: Secondary | ICD-10-CM | POA: Diagnosis not present

## 2020-07-03 DIAGNOSIS — I129 Hypertensive chronic kidney disease with stage 1 through stage 4 chronic kidney disease, or unspecified chronic kidney disease: Secondary | ICD-10-CM | POA: Diagnosis not present

## 2020-07-03 DIAGNOSIS — I152 Hypertension secondary to endocrine disorders: Secondary | ICD-10-CM | POA: Diagnosis not present

## 2020-07-18 DIAGNOSIS — I152 Hypertension secondary to endocrine disorders: Secondary | ICD-10-CM | POA: Diagnosis not present

## 2020-07-18 DIAGNOSIS — Z6828 Body mass index (BMI) 28.0-28.9, adult: Secondary | ICD-10-CM | POA: Diagnosis not present

## 2020-07-18 DIAGNOSIS — E1159 Type 2 diabetes mellitus with other circulatory complications: Secondary | ICD-10-CM | POA: Diagnosis not present

## 2020-07-27 DIAGNOSIS — Z6828 Body mass index (BMI) 28.0-28.9, adult: Secondary | ICD-10-CM | POA: Diagnosis not present

## 2020-07-27 DIAGNOSIS — E1159 Type 2 diabetes mellitus with other circulatory complications: Secondary | ICD-10-CM | POA: Diagnosis not present

## 2020-07-27 DIAGNOSIS — I152 Hypertension secondary to endocrine disorders: Secondary | ICD-10-CM | POA: Diagnosis not present

## 2020-08-02 DIAGNOSIS — Z23 Encounter for immunization: Secondary | ICD-10-CM | POA: Diagnosis not present

## 2020-08-04 DIAGNOSIS — I10 Essential (primary) hypertension: Secondary | ICD-10-CM | POA: Diagnosis not present

## 2020-08-29 DIAGNOSIS — E1159 Type 2 diabetes mellitus with other circulatory complications: Secondary | ICD-10-CM | POA: Diagnosis not present

## 2020-08-29 DIAGNOSIS — I152 Hypertension secondary to endocrine disorders: Secondary | ICD-10-CM | POA: Diagnosis not present

## 2020-08-29 DIAGNOSIS — Z23 Encounter for immunization: Secondary | ICD-10-CM | POA: Diagnosis not present

## 2020-08-29 DIAGNOSIS — D429 Neoplasm of uncertain behavior of meninges, unspecified: Secondary | ICD-10-CM | POA: Diagnosis not present

## 2020-09-04 DIAGNOSIS — I10 Essential (primary) hypertension: Secondary | ICD-10-CM | POA: Diagnosis not present

## 2020-09-05 DIAGNOSIS — Z6828 Body mass index (BMI) 28.0-28.9, adult: Secondary | ICD-10-CM | POA: Diagnosis not present

## 2020-09-05 DIAGNOSIS — M2550 Pain in unspecified joint: Secondary | ICD-10-CM | POA: Diagnosis not present

## 2020-09-29 DIAGNOSIS — Z6828 Body mass index (BMI) 28.0-28.9, adult: Secondary | ICD-10-CM | POA: Diagnosis not present

## 2020-09-29 DIAGNOSIS — E1159 Type 2 diabetes mellitus with other circulatory complications: Secondary | ICD-10-CM | POA: Diagnosis not present

## 2020-09-29 DIAGNOSIS — I152 Hypertension secondary to endocrine disorders: Secondary | ICD-10-CM | POA: Diagnosis not present

## 2020-10-13 DIAGNOSIS — E1369 Other specified diabetes mellitus with other specified complication: Secondary | ICD-10-CM | POA: Diagnosis not present

## 2020-10-13 DIAGNOSIS — M109 Gout, unspecified: Secondary | ICD-10-CM | POA: Diagnosis not present

## 2020-10-20 DIAGNOSIS — I129 Hypertensive chronic kidney disease with stage 1 through stage 4 chronic kidney disease, or unspecified chronic kidney disease: Secondary | ICD-10-CM | POA: Diagnosis not present

## 2020-10-20 DIAGNOSIS — E1169 Type 2 diabetes mellitus with other specified complication: Secondary | ICD-10-CM | POA: Diagnosis not present

## 2020-10-20 DIAGNOSIS — N182 Chronic kidney disease, stage 2 (mild): Secondary | ICD-10-CM | POA: Diagnosis not present

## 2020-10-20 DIAGNOSIS — E782 Mixed hyperlipidemia: Secondary | ICD-10-CM | POA: Diagnosis not present

## 2020-11-20 DIAGNOSIS — N182 Chronic kidney disease, stage 2 (mild): Secondary | ICD-10-CM | POA: Diagnosis not present

## 2020-11-20 DIAGNOSIS — E1169 Type 2 diabetes mellitus with other specified complication: Secondary | ICD-10-CM | POA: Diagnosis not present

## 2020-11-20 DIAGNOSIS — E782 Mixed hyperlipidemia: Secondary | ICD-10-CM | POA: Diagnosis not present

## 2020-11-20 DIAGNOSIS — I129 Hypertensive chronic kidney disease with stage 1 through stage 4 chronic kidney disease, or unspecified chronic kidney disease: Secondary | ICD-10-CM | POA: Diagnosis not present

## 2021-02-01 DIAGNOSIS — I129 Hypertensive chronic kidney disease with stage 1 through stage 4 chronic kidney disease, or unspecified chronic kidney disease: Secondary | ICD-10-CM | POA: Diagnosis not present

## 2021-03-04 DIAGNOSIS — I10 Essential (primary) hypertension: Secondary | ICD-10-CM | POA: Diagnosis not present

## 2021-04-04 DIAGNOSIS — E1169 Type 2 diabetes mellitus with other specified complication: Secondary | ICD-10-CM | POA: Diagnosis not present

## 2021-04-04 DIAGNOSIS — I10 Essential (primary) hypertension: Secondary | ICD-10-CM | POA: Diagnosis not present

## 2021-04-11 DIAGNOSIS — E119 Type 2 diabetes mellitus without complications: Secondary | ICD-10-CM | POA: Diagnosis not present

## 2021-04-11 DIAGNOSIS — L608 Other nail disorders: Secondary | ICD-10-CM | POA: Diagnosis not present

## 2021-04-11 DIAGNOSIS — Z7984 Long term (current) use of oral hypoglycemic drugs: Secondary | ICD-10-CM | POA: Diagnosis not present

## 2021-04-11 DIAGNOSIS — L600 Ingrowing nail: Secondary | ICD-10-CM | POA: Diagnosis not present

## 2021-04-11 DIAGNOSIS — L602 Onychogryphosis: Secondary | ICD-10-CM | POA: Diagnosis not present

## 2021-04-12 DIAGNOSIS — E1169 Type 2 diabetes mellitus with other specified complication: Secondary | ICD-10-CM | POA: Diagnosis not present

## 2021-04-12 DIAGNOSIS — E1159 Type 2 diabetes mellitus with other circulatory complications: Secondary | ICD-10-CM | POA: Diagnosis not present

## 2021-04-12 DIAGNOSIS — Z139 Encounter for screening, unspecified: Secondary | ICD-10-CM | POA: Diagnosis not present

## 2021-04-12 DIAGNOSIS — I152 Hypertension secondary to endocrine disorders: Secondary | ICD-10-CM | POA: Diagnosis not present

## 2021-04-12 DIAGNOSIS — Z1331 Encounter for screening for depression: Secondary | ICD-10-CM | POA: Diagnosis not present

## 2021-04-12 DIAGNOSIS — Z23 Encounter for immunization: Secondary | ICD-10-CM | POA: Diagnosis not present

## 2021-04-12 DIAGNOSIS — Z136 Encounter for screening for cardiovascular disorders: Secondary | ICD-10-CM | POA: Diagnosis not present

## 2021-04-12 DIAGNOSIS — E782 Mixed hyperlipidemia: Secondary | ICD-10-CM | POA: Diagnosis not present

## 2021-04-12 DIAGNOSIS — Z0000 Encounter for general adult medical examination without abnormal findings: Secondary | ICD-10-CM | POA: Diagnosis not present

## 2021-04-17 DIAGNOSIS — Z79899 Other long term (current) drug therapy: Secondary | ICD-10-CM | POA: Diagnosis not present

## 2021-05-04 DIAGNOSIS — I10 Essential (primary) hypertension: Secondary | ICD-10-CM | POA: Diagnosis not present

## 2021-05-05 DIAGNOSIS — I152 Hypertension secondary to endocrine disorders: Secondary | ICD-10-CM | POA: Diagnosis not present

## 2021-05-05 DIAGNOSIS — E1159 Type 2 diabetes mellitus with other circulatory complications: Secondary | ICD-10-CM | POA: Diagnosis not present

## 2021-05-05 DIAGNOSIS — I10 Essential (primary) hypertension: Secondary | ICD-10-CM | POA: Diagnosis not present

## 2021-05-07 DIAGNOSIS — D420 Neoplasm of uncertain behavior of cerebral meninges: Secondary | ICD-10-CM | POA: Diagnosis not present

## 2021-05-07 DIAGNOSIS — D320 Benign neoplasm of cerebral meninges: Secondary | ICD-10-CM | POA: Diagnosis not present

## 2021-05-07 DIAGNOSIS — G9389 Other specified disorders of brain: Secondary | ICD-10-CM | POA: Diagnosis not present

## 2021-06-04 DIAGNOSIS — I10 Essential (primary) hypertension: Secondary | ICD-10-CM | POA: Diagnosis not present

## 2021-06-05 DIAGNOSIS — I10 Essential (primary) hypertension: Secondary | ICD-10-CM | POA: Diagnosis not present

## 2021-06-05 DIAGNOSIS — E1159 Type 2 diabetes mellitus with other circulatory complications: Secondary | ICD-10-CM | POA: Diagnosis not present

## 2021-06-05 DIAGNOSIS — I152 Hypertension secondary to endocrine disorders: Secondary | ICD-10-CM | POA: Diagnosis not present

## 2021-06-07 DIAGNOSIS — I129 Hypertensive chronic kidney disease with stage 1 through stage 4 chronic kidney disease, or unspecified chronic kidney disease: Secondary | ICD-10-CM | POA: Diagnosis not present

## 2021-06-07 DIAGNOSIS — G8310 Monoplegia of lower limb affecting unspecified side: Secondary | ICD-10-CM | POA: Diagnosis not present

## 2021-06-07 DIAGNOSIS — Z6829 Body mass index (BMI) 29.0-29.9, adult: Secondary | ICD-10-CM | POA: Diagnosis not present

## 2021-06-07 DIAGNOSIS — N182 Chronic kidney disease, stage 2 (mild): Secondary | ICD-10-CM | POA: Diagnosis not present

## 2021-06-08 DIAGNOSIS — Z79899 Other long term (current) drug therapy: Secondary | ICD-10-CM | POA: Diagnosis not present

## 2021-07-02 DIAGNOSIS — I129 Hypertensive chronic kidney disease with stage 1 through stage 4 chronic kidney disease, or unspecified chronic kidney disease: Secondary | ICD-10-CM | POA: Diagnosis not present

## 2021-07-02 DIAGNOSIS — N182 Chronic kidney disease, stage 2 (mild): Secondary | ICD-10-CM | POA: Diagnosis not present

## 2021-07-02 DIAGNOSIS — Z6829 Body mass index (BMI) 29.0-29.9, adult: Secondary | ICD-10-CM | POA: Diagnosis not present

## 2021-07-04 DIAGNOSIS — I129 Hypertensive chronic kidney disease with stage 1 through stage 4 chronic kidney disease, or unspecified chronic kidney disease: Secondary | ICD-10-CM | POA: Diagnosis not present

## 2021-07-05 DIAGNOSIS — I152 Hypertension secondary to endocrine disorders: Secondary | ICD-10-CM | POA: Diagnosis not present

## 2021-07-05 DIAGNOSIS — E1159 Type 2 diabetes mellitus with other circulatory complications: Secondary | ICD-10-CM | POA: Diagnosis not present

## 2021-07-26 DIAGNOSIS — N182 Chronic kidney disease, stage 2 (mild): Secondary | ICD-10-CM | POA: Diagnosis not present

## 2021-07-26 DIAGNOSIS — E782 Mixed hyperlipidemia: Secondary | ICD-10-CM | POA: Diagnosis not present

## 2021-07-26 DIAGNOSIS — I129 Hypertensive chronic kidney disease with stage 1 through stage 4 chronic kidney disease, or unspecified chronic kidney disease: Secondary | ICD-10-CM | POA: Diagnosis not present

## 2021-07-26 DIAGNOSIS — E1169 Type 2 diabetes mellitus with other specified complication: Secondary | ICD-10-CM | POA: Diagnosis not present

## 2021-08-05 DIAGNOSIS — E1159 Type 2 diabetes mellitus with other circulatory complications: Secondary | ICD-10-CM | POA: Diagnosis not present

## 2021-08-05 DIAGNOSIS — E782 Mixed hyperlipidemia: Secondary | ICD-10-CM | POA: Diagnosis not present

## 2021-08-13 DIAGNOSIS — E1169 Type 2 diabetes mellitus with other specified complication: Secondary | ICD-10-CM | POA: Diagnosis not present

## 2021-08-16 DIAGNOSIS — L820 Inflamed seborrheic keratosis: Secondary | ICD-10-CM | POA: Diagnosis not present

## 2021-08-20 DIAGNOSIS — I129 Hypertensive chronic kidney disease with stage 1 through stage 4 chronic kidney disease, or unspecified chronic kidney disease: Secondary | ICD-10-CM | POA: Diagnosis not present

## 2021-08-20 DIAGNOSIS — N182 Chronic kidney disease, stage 2 (mild): Secondary | ICD-10-CM | POA: Diagnosis not present

## 2021-08-20 DIAGNOSIS — E1169 Type 2 diabetes mellitus with other specified complication: Secondary | ICD-10-CM | POA: Diagnosis not present

## 2021-08-20 DIAGNOSIS — E782 Mixed hyperlipidemia: Secondary | ICD-10-CM | POA: Diagnosis not present

## 2021-08-27 DIAGNOSIS — Z7984 Long term (current) use of oral hypoglycemic drugs: Secondary | ICD-10-CM | POA: Diagnosis not present

## 2021-08-27 DIAGNOSIS — E119 Type 2 diabetes mellitus without complications: Secondary | ICD-10-CM | POA: Diagnosis not present

## 2021-08-27 DIAGNOSIS — L608 Other nail disorders: Secondary | ICD-10-CM | POA: Diagnosis not present

## 2021-09-05 DIAGNOSIS — E782 Mixed hyperlipidemia: Secondary | ICD-10-CM | POA: Diagnosis not present

## 2021-09-05 DIAGNOSIS — I129 Hypertensive chronic kidney disease with stage 1 through stage 4 chronic kidney disease, or unspecified chronic kidney disease: Secondary | ICD-10-CM | POA: Diagnosis not present

## 2021-09-05 DIAGNOSIS — E1169 Type 2 diabetes mellitus with other specified complication: Secondary | ICD-10-CM | POA: Diagnosis not present

## 2021-10-03 DIAGNOSIS — I129 Hypertensive chronic kidney disease with stage 1 through stage 4 chronic kidney disease, or unspecified chronic kidney disease: Secondary | ICD-10-CM | POA: Diagnosis not present

## 2021-10-03 DIAGNOSIS — E782 Mixed hyperlipidemia: Secondary | ICD-10-CM | POA: Diagnosis not present

## 2021-10-03 DIAGNOSIS — E1169 Type 2 diabetes mellitus with other specified complication: Secondary | ICD-10-CM | POA: Diagnosis not present

## 2021-10-08 DIAGNOSIS — E1159 Type 2 diabetes mellitus with other circulatory complications: Secondary | ICD-10-CM | POA: Diagnosis not present

## 2021-10-08 DIAGNOSIS — I152 Hypertension secondary to endocrine disorders: Secondary | ICD-10-CM | POA: Diagnosis not present

## 2021-10-29 DIAGNOSIS — Z794 Long term (current) use of insulin: Secondary | ICD-10-CM | POA: Diagnosis not present

## 2021-10-29 DIAGNOSIS — L602 Onychogryphosis: Secondary | ICD-10-CM | POA: Diagnosis not present

## 2021-10-29 DIAGNOSIS — L608 Other nail disorders: Secondary | ICD-10-CM | POA: Diagnosis not present

## 2021-10-29 DIAGNOSIS — L600 Ingrowing nail: Secondary | ICD-10-CM | POA: Diagnosis not present

## 2021-10-29 DIAGNOSIS — E119 Type 2 diabetes mellitus without complications: Secondary | ICD-10-CM | POA: Diagnosis not present

## 2021-11-03 DIAGNOSIS — D332 Benign neoplasm of brain, unspecified: Secondary | ICD-10-CM | POA: Diagnosis not present

## 2021-11-03 DIAGNOSIS — I129 Hypertensive chronic kidney disease with stage 1 through stage 4 chronic kidney disease, or unspecified chronic kidney disease: Secondary | ICD-10-CM | POA: Diagnosis not present

## 2021-11-12 DIAGNOSIS — E1159 Type 2 diabetes mellitus with other circulatory complications: Secondary | ICD-10-CM | POA: Diagnosis not present

## 2021-11-12 DIAGNOSIS — E1169 Type 2 diabetes mellitus with other specified complication: Secondary | ICD-10-CM | POA: Diagnosis not present

## 2021-11-15 DIAGNOSIS — M791 Myalgia, unspecified site: Secondary | ICD-10-CM | POA: Diagnosis not present

## 2021-11-15 DIAGNOSIS — M545 Low back pain, unspecified: Secondary | ICD-10-CM | POA: Diagnosis not present

## 2021-11-15 DIAGNOSIS — G8310 Monoplegia of lower limb affecting unspecified side: Secondary | ICD-10-CM | POA: Diagnosis not present

## 2021-11-19 DIAGNOSIS — L608 Other nail disorders: Secondary | ICD-10-CM | POA: Diagnosis not present

## 2021-11-19 DIAGNOSIS — L600 Ingrowing nail: Secondary | ICD-10-CM | POA: Diagnosis not present

## 2021-11-26 DIAGNOSIS — I152 Hypertension secondary to endocrine disorders: Secondary | ICD-10-CM | POA: Diagnosis not present

## 2021-11-26 DIAGNOSIS — M461 Sacroiliitis, not elsewhere classified: Secondary | ICD-10-CM | POA: Diagnosis not present

## 2021-11-26 DIAGNOSIS — E1159 Type 2 diabetes mellitus with other circulatory complications: Secondary | ICD-10-CM | POA: Diagnosis not present

## 2021-11-26 DIAGNOSIS — E785 Hyperlipidemia, unspecified: Secondary | ICD-10-CM | POA: Diagnosis not present

## 2021-11-27 DIAGNOSIS — M461 Sacroiliitis, not elsewhere classified: Secondary | ICD-10-CM | POA: Diagnosis not present

## 2021-12-02 DIAGNOSIS — I129 Hypertensive chronic kidney disease with stage 1 through stage 4 chronic kidney disease, or unspecified chronic kidney disease: Secondary | ICD-10-CM | POA: Diagnosis not present

## 2021-12-02 DIAGNOSIS — D332 Benign neoplasm of brain, unspecified: Secondary | ICD-10-CM | POA: Diagnosis not present

## 2021-12-02 DIAGNOSIS — E1169 Type 2 diabetes mellitus with other specified complication: Secondary | ICD-10-CM | POA: Diagnosis not present

## 2021-12-03 DIAGNOSIS — I129 Hypertensive chronic kidney disease with stage 1 through stage 4 chronic kidney disease, or unspecified chronic kidney disease: Secondary | ICD-10-CM | POA: Diagnosis not present

## 2021-12-03 DIAGNOSIS — D332 Benign neoplasm of brain, unspecified: Secondary | ICD-10-CM | POA: Diagnosis not present

## 2021-12-25 DIAGNOSIS — I152 Hypertension secondary to endocrine disorders: Secondary | ICD-10-CM | POA: Diagnosis not present

## 2021-12-25 DIAGNOSIS — M461 Sacroiliitis, not elsewhere classified: Secondary | ICD-10-CM | POA: Diagnosis not present

## 2021-12-25 DIAGNOSIS — E1159 Type 2 diabetes mellitus with other circulatory complications: Secondary | ICD-10-CM | POA: Diagnosis not present

## 2022-01-01 DIAGNOSIS — M545 Low back pain, unspecified: Secondary | ICD-10-CM | POA: Diagnosis not present

## 2022-01-02 DIAGNOSIS — I129 Hypertensive chronic kidney disease with stage 1 through stage 4 chronic kidney disease, or unspecified chronic kidney disease: Secondary | ICD-10-CM | POA: Diagnosis not present

## 2022-01-03 DIAGNOSIS — I129 Hypertensive chronic kidney disease with stage 1 through stage 4 chronic kidney disease, or unspecified chronic kidney disease: Secondary | ICD-10-CM | POA: Diagnosis not present

## 2022-01-03 DIAGNOSIS — M461 Sacroiliitis, not elsewhere classified: Secondary | ICD-10-CM | POA: Diagnosis not present

## 2022-01-10 DIAGNOSIS — E119 Type 2 diabetes mellitus without complications: Secondary | ICD-10-CM | POA: Diagnosis not present

## 2022-01-10 DIAGNOSIS — Z7984 Long term (current) use of oral hypoglycemic drugs: Secondary | ICD-10-CM | POA: Diagnosis not present

## 2022-01-17 DIAGNOSIS — M545 Low back pain, unspecified: Secondary | ICD-10-CM | POA: Diagnosis not present

## 2022-01-17 DIAGNOSIS — I152 Hypertension secondary to endocrine disorders: Secondary | ICD-10-CM | POA: Diagnosis not present

## 2022-01-17 DIAGNOSIS — E1159 Type 2 diabetes mellitus with other circulatory complications: Secondary | ICD-10-CM | POA: Diagnosis not present

## 2022-01-22 DIAGNOSIS — M545 Low back pain, unspecified: Secondary | ICD-10-CM | POA: Diagnosis not present

## 2022-01-22 DIAGNOSIS — R251 Tremor, unspecified: Secondary | ICD-10-CM | POA: Diagnosis not present

## 2022-01-24 DIAGNOSIS — M545 Low back pain, unspecified: Secondary | ICD-10-CM | POA: Diagnosis not present

## 2022-01-24 DIAGNOSIS — M7918 Myalgia, other site: Secondary | ICD-10-CM | POA: Diagnosis not present

## 2022-01-29 DIAGNOSIS — M47816 Spondylosis without myelopathy or radiculopathy, lumbar region: Secondary | ICD-10-CM | POA: Diagnosis not present

## 2022-02-01 DIAGNOSIS — E1169 Type 2 diabetes mellitus with other specified complication: Secondary | ICD-10-CM | POA: Diagnosis not present

## 2022-02-01 DIAGNOSIS — I129 Hypertensive chronic kidney disease with stage 1 through stage 4 chronic kidney disease, or unspecified chronic kidney disease: Secondary | ICD-10-CM | POA: Diagnosis not present

## 2022-02-02 DIAGNOSIS — I129 Hypertensive chronic kidney disease with stage 1 through stage 4 chronic kidney disease, or unspecified chronic kidney disease: Secondary | ICD-10-CM | POA: Diagnosis not present

## 2022-02-02 DIAGNOSIS — M461 Sacroiliitis, not elsewhere classified: Secondary | ICD-10-CM | POA: Diagnosis not present

## 2022-02-06 DIAGNOSIS — M545 Low back pain, unspecified: Secondary | ICD-10-CM | POA: Diagnosis not present

## 2022-02-14 DIAGNOSIS — M7918 Myalgia, other site: Secondary | ICD-10-CM | POA: Diagnosis not present

## 2022-02-14 DIAGNOSIS — M545 Low back pain, unspecified: Secondary | ICD-10-CM | POA: Diagnosis not present

## 2022-02-26 DIAGNOSIS — R52 Pain, unspecified: Secondary | ICD-10-CM | POA: Diagnosis not present

## 2022-02-26 DIAGNOSIS — W1840XA Slipping, tripping and stumbling without falling, unspecified, initial encounter: Secondary | ICD-10-CM | POA: Diagnosis not present

## 2022-02-26 DIAGNOSIS — S43004A Unspecified dislocation of right shoulder joint, initial encounter: Secondary | ICD-10-CM | POA: Diagnosis not present

## 2022-02-26 DIAGNOSIS — Y9289 Other specified places as the place of occurrence of the external cause: Secondary | ICD-10-CM | POA: Diagnosis not present

## 2022-02-26 DIAGNOSIS — Z882 Allergy status to sulfonamides status: Secondary | ICD-10-CM | POA: Diagnosis not present

## 2022-02-26 DIAGNOSIS — Z888 Allergy status to other drugs, medicaments and biological substances status: Secondary | ICD-10-CM | POA: Diagnosis not present

## 2022-02-26 DIAGNOSIS — Z885 Allergy status to narcotic agent status: Secondary | ICD-10-CM | POA: Diagnosis not present

## 2022-02-26 DIAGNOSIS — M25511 Pain in right shoulder: Secondary | ICD-10-CM | POA: Diagnosis not present

## 2022-02-26 DIAGNOSIS — S43014A Anterior dislocation of right humerus, initial encounter: Secondary | ICD-10-CM | POA: Diagnosis not present

## 2022-03-04 DIAGNOSIS — E1169 Type 2 diabetes mellitus with other specified complication: Secondary | ICD-10-CM | POA: Diagnosis not present

## 2022-03-04 DIAGNOSIS — I129 Hypertensive chronic kidney disease with stage 1 through stage 4 chronic kidney disease, or unspecified chronic kidney disease: Secondary | ICD-10-CM | POA: Diagnosis not present

## 2022-03-05 DIAGNOSIS — I129 Hypertensive chronic kidney disease with stage 1 through stage 4 chronic kidney disease, or unspecified chronic kidney disease: Secondary | ICD-10-CM | POA: Diagnosis not present

## 2022-03-05 DIAGNOSIS — M461 Sacroiliitis, not elsewhere classified: Secondary | ICD-10-CM | POA: Diagnosis not present

## 2022-03-06 DIAGNOSIS — S43004A Unspecified dislocation of right shoulder joint, initial encounter: Secondary | ICD-10-CM | POA: Diagnosis not present

## 2022-03-14 DIAGNOSIS — M7918 Myalgia, other site: Secondary | ICD-10-CM | POA: Diagnosis not present

## 2022-03-14 DIAGNOSIS — M5416 Radiculopathy, lumbar region: Secondary | ICD-10-CM | POA: Diagnosis not present

## 2022-03-14 DIAGNOSIS — M545 Low back pain, unspecified: Secondary | ICD-10-CM | POA: Diagnosis not present

## 2022-04-05 DIAGNOSIS — I129 Hypertensive chronic kidney disease with stage 1 through stage 4 chronic kidney disease, or unspecified chronic kidney disease: Secondary | ICD-10-CM | POA: Diagnosis not present

## 2022-04-05 DIAGNOSIS — M461 Sacroiliitis, not elsewhere classified: Secondary | ICD-10-CM | POA: Diagnosis not present

## 2022-04-15 DIAGNOSIS — E119 Type 2 diabetes mellitus without complications: Secondary | ICD-10-CM | POA: Diagnosis not present

## 2022-04-15 DIAGNOSIS — L602 Onychogryphosis: Secondary | ICD-10-CM | POA: Diagnosis not present

## 2022-04-15 DIAGNOSIS — Z7984 Long term (current) use of oral hypoglycemic drugs: Secondary | ICD-10-CM | POA: Diagnosis not present

## 2022-05-02 DIAGNOSIS — E782 Mixed hyperlipidemia: Secondary | ICD-10-CM | POA: Diagnosis not present

## 2022-05-02 DIAGNOSIS — Z23 Encounter for immunization: Secondary | ICD-10-CM | POA: Diagnosis not present

## 2022-05-02 DIAGNOSIS — E1169 Type 2 diabetes mellitus with other specified complication: Secondary | ICD-10-CM | POA: Diagnosis not present

## 2022-05-02 DIAGNOSIS — R296 Repeated falls: Secondary | ICD-10-CM | POA: Diagnosis not present

## 2022-05-02 DIAGNOSIS — Z0000 Encounter for general adult medical examination without abnormal findings: Secondary | ICD-10-CM | POA: Diagnosis not present

## 2022-05-02 DIAGNOSIS — M545 Low back pain, unspecified: Secondary | ICD-10-CM | POA: Diagnosis not present

## 2022-05-02 DIAGNOSIS — Z139 Encounter for screening, unspecified: Secondary | ICD-10-CM | POA: Diagnosis not present

## 2022-05-05 DIAGNOSIS — M461 Sacroiliitis, not elsewhere classified: Secondary | ICD-10-CM | POA: Diagnosis not present

## 2022-05-05 DIAGNOSIS — I129 Hypertensive chronic kidney disease with stage 1 through stage 4 chronic kidney disease, or unspecified chronic kidney disease: Secondary | ICD-10-CM | POA: Diagnosis not present

## 2022-06-04 DIAGNOSIS — E782 Mixed hyperlipidemia: Secondary | ICD-10-CM | POA: Diagnosis not present

## 2022-06-04 DIAGNOSIS — G8314 Monoplegia of lower limb affecting left nondominant side: Secondary | ICD-10-CM | POA: Diagnosis not present

## 2022-06-04 DIAGNOSIS — M7711 Lateral epicondylitis, right elbow: Secondary | ICD-10-CM | POA: Diagnosis not present

## 2022-06-04 DIAGNOSIS — H9190 Unspecified hearing loss, unspecified ear: Secondary | ICD-10-CM | POA: Diagnosis not present

## 2022-06-04 DIAGNOSIS — E1169 Type 2 diabetes mellitus with other specified complication: Secondary | ICD-10-CM | POA: Diagnosis not present

## 2022-06-04 DIAGNOSIS — I7090 Unspecified atherosclerosis: Secondary | ICD-10-CM | POA: Diagnosis not present

## 2022-06-04 DIAGNOSIS — M438X4 Other specified deforming dorsopathies, thoracic region: Secondary | ICD-10-CM | POA: Diagnosis not present

## 2022-06-04 DIAGNOSIS — M461 Sacroiliitis, not elsewhere classified: Secondary | ICD-10-CM | POA: Diagnosis not present

## 2022-06-04 DIAGNOSIS — M47815 Spondylosis without myelopathy or radiculopathy, thoracolumbar region: Secondary | ICD-10-CM | POA: Diagnosis not present

## 2022-06-04 DIAGNOSIS — L409 Psoriasis, unspecified: Secondary | ICD-10-CM | POA: Diagnosis not present

## 2022-06-04 DIAGNOSIS — I129 Hypertensive chronic kidney disease with stage 1 through stage 4 chronic kidney disease, or unspecified chronic kidney disease: Secondary | ICD-10-CM | POA: Diagnosis not present

## 2022-06-04 DIAGNOSIS — Z7985 Long-term (current) use of injectable non-insulin antidiabetic drugs: Secondary | ICD-10-CM | POA: Diagnosis not present

## 2022-06-04 DIAGNOSIS — Z7984 Long term (current) use of oral hypoglycemic drugs: Secondary | ICD-10-CM | POA: Diagnosis not present

## 2022-06-04 DIAGNOSIS — Z9049 Acquired absence of other specified parts of digestive tract: Secondary | ICD-10-CM | POA: Diagnosis not present

## 2022-06-04 DIAGNOSIS — M62838 Other muscle spasm: Secondary | ICD-10-CM | POA: Diagnosis not present

## 2022-06-04 DIAGNOSIS — Z86011 Personal history of benign neoplasm of the brain: Secondary | ICD-10-CM | POA: Diagnosis not present

## 2022-06-04 DIAGNOSIS — N182 Chronic kidney disease, stage 2 (mild): Secondary | ICD-10-CM | POA: Diagnosis not present

## 2022-06-04 DIAGNOSIS — Z9181 History of falling: Secondary | ICD-10-CM | POA: Diagnosis not present

## 2022-06-04 DIAGNOSIS — M19011 Primary osteoarthritis, right shoulder: Secondary | ICD-10-CM | POA: Diagnosis not present

## 2022-06-04 DIAGNOSIS — L308 Other specified dermatitis: Secondary | ICD-10-CM | POA: Diagnosis not present

## 2022-06-04 DIAGNOSIS — M533 Sacrococcygeal disorders, not elsewhere classified: Secondary | ICD-10-CM | POA: Diagnosis not present

## 2022-06-04 DIAGNOSIS — E1159 Type 2 diabetes mellitus with other circulatory complications: Secondary | ICD-10-CM | POA: Diagnosis not present

## 2022-06-05 DIAGNOSIS — I129 Hypertensive chronic kidney disease with stage 1 through stage 4 chronic kidney disease, or unspecified chronic kidney disease: Secondary | ICD-10-CM | POA: Diagnosis not present

## 2022-06-05 DIAGNOSIS — E782 Mixed hyperlipidemia: Secondary | ICD-10-CM | POA: Diagnosis not present

## 2022-06-05 DIAGNOSIS — M461 Sacroiliitis, not elsewhere classified: Secondary | ICD-10-CM | POA: Diagnosis not present

## 2022-06-05 DIAGNOSIS — R42 Dizziness and giddiness: Secondary | ICD-10-CM | POA: Diagnosis not present

## 2022-06-05 DIAGNOSIS — E1169 Type 2 diabetes mellitus with other specified complication: Secondary | ICD-10-CM | POA: Diagnosis not present

## 2022-06-07 DIAGNOSIS — M62838 Other muscle spasm: Secondary | ICD-10-CM | POA: Diagnosis not present

## 2022-06-07 DIAGNOSIS — M19011 Primary osteoarthritis, right shoulder: Secondary | ICD-10-CM | POA: Diagnosis not present

## 2022-06-07 DIAGNOSIS — Z9049 Acquired absence of other specified parts of digestive tract: Secondary | ICD-10-CM | POA: Diagnosis not present

## 2022-06-07 DIAGNOSIS — M438X4 Other specified deforming dorsopathies, thoracic region: Secondary | ICD-10-CM | POA: Diagnosis not present

## 2022-06-07 DIAGNOSIS — M533 Sacrococcygeal disorders, not elsewhere classified: Secondary | ICD-10-CM | POA: Diagnosis not present

## 2022-06-07 DIAGNOSIS — M7711 Lateral epicondylitis, right elbow: Secondary | ICD-10-CM | POA: Diagnosis not present

## 2022-06-07 DIAGNOSIS — Z86011 Personal history of benign neoplasm of the brain: Secondary | ICD-10-CM | POA: Diagnosis not present

## 2022-06-07 DIAGNOSIS — L409 Psoriasis, unspecified: Secondary | ICD-10-CM | POA: Diagnosis not present

## 2022-06-07 DIAGNOSIS — M461 Sacroiliitis, not elsewhere classified: Secondary | ICD-10-CM | POA: Diagnosis not present

## 2022-06-07 DIAGNOSIS — G8314 Monoplegia of lower limb affecting left nondominant side: Secondary | ICD-10-CM | POA: Diagnosis not present

## 2022-06-07 DIAGNOSIS — E1169 Type 2 diabetes mellitus with other specified complication: Secondary | ICD-10-CM | POA: Diagnosis not present

## 2022-06-07 DIAGNOSIS — I7090 Unspecified atherosclerosis: Secondary | ICD-10-CM | POA: Diagnosis not present

## 2022-06-07 DIAGNOSIS — N182 Chronic kidney disease, stage 2 (mild): Secondary | ICD-10-CM | POA: Diagnosis not present

## 2022-06-07 DIAGNOSIS — E1159 Type 2 diabetes mellitus with other circulatory complications: Secondary | ICD-10-CM | POA: Diagnosis not present

## 2022-06-07 DIAGNOSIS — M47815 Spondylosis without myelopathy or radiculopathy, thoracolumbar region: Secondary | ICD-10-CM | POA: Diagnosis not present

## 2022-06-07 DIAGNOSIS — E782 Mixed hyperlipidemia: Secondary | ICD-10-CM | POA: Diagnosis not present

## 2022-06-07 DIAGNOSIS — Z7984 Long term (current) use of oral hypoglycemic drugs: Secondary | ICD-10-CM | POA: Diagnosis not present

## 2022-06-07 DIAGNOSIS — Z7985 Long-term (current) use of injectable non-insulin antidiabetic drugs: Secondary | ICD-10-CM | POA: Diagnosis not present

## 2022-06-07 DIAGNOSIS — Z9181 History of falling: Secondary | ICD-10-CM | POA: Diagnosis not present

## 2022-06-07 DIAGNOSIS — I129 Hypertensive chronic kidney disease with stage 1 through stage 4 chronic kidney disease, or unspecified chronic kidney disease: Secondary | ICD-10-CM | POA: Diagnosis not present

## 2022-06-07 DIAGNOSIS — L308 Other specified dermatitis: Secondary | ICD-10-CM | POA: Diagnosis not present

## 2022-06-07 DIAGNOSIS — H9190 Unspecified hearing loss, unspecified ear: Secondary | ICD-10-CM | POA: Diagnosis not present

## 2022-06-11 DIAGNOSIS — I129 Hypertensive chronic kidney disease with stage 1 through stage 4 chronic kidney disease, or unspecified chronic kidney disease: Secondary | ICD-10-CM | POA: Diagnosis not present

## 2022-06-11 DIAGNOSIS — Z7985 Long-term (current) use of injectable non-insulin antidiabetic drugs: Secondary | ICD-10-CM | POA: Diagnosis not present

## 2022-06-11 DIAGNOSIS — Z86011 Personal history of benign neoplasm of the brain: Secondary | ICD-10-CM | POA: Diagnosis not present

## 2022-06-11 DIAGNOSIS — M438X4 Other specified deforming dorsopathies, thoracic region: Secondary | ICD-10-CM | POA: Diagnosis not present

## 2022-06-11 DIAGNOSIS — M7711 Lateral epicondylitis, right elbow: Secondary | ICD-10-CM | POA: Diagnosis not present

## 2022-06-11 DIAGNOSIS — N182 Chronic kidney disease, stage 2 (mild): Secondary | ICD-10-CM | POA: Diagnosis not present

## 2022-06-11 DIAGNOSIS — E782 Mixed hyperlipidemia: Secondary | ICD-10-CM | POA: Diagnosis not present

## 2022-06-11 DIAGNOSIS — M19011 Primary osteoarthritis, right shoulder: Secondary | ICD-10-CM | POA: Diagnosis not present

## 2022-06-11 DIAGNOSIS — Z9181 History of falling: Secondary | ICD-10-CM | POA: Diagnosis not present

## 2022-06-11 DIAGNOSIS — I7090 Unspecified atherosclerosis: Secondary | ICD-10-CM | POA: Diagnosis not present

## 2022-06-11 DIAGNOSIS — E1169 Type 2 diabetes mellitus with other specified complication: Secondary | ICD-10-CM | POA: Diagnosis not present

## 2022-06-11 DIAGNOSIS — M62838 Other muscle spasm: Secondary | ICD-10-CM | POA: Diagnosis not present

## 2022-06-11 DIAGNOSIS — Z7984 Long term (current) use of oral hypoglycemic drugs: Secondary | ICD-10-CM | POA: Diagnosis not present

## 2022-06-11 DIAGNOSIS — Z9049 Acquired absence of other specified parts of digestive tract: Secondary | ICD-10-CM | POA: Diagnosis not present

## 2022-06-11 DIAGNOSIS — E1159 Type 2 diabetes mellitus with other circulatory complications: Secondary | ICD-10-CM | POA: Diagnosis not present

## 2022-06-11 DIAGNOSIS — M461 Sacroiliitis, not elsewhere classified: Secondary | ICD-10-CM | POA: Diagnosis not present

## 2022-06-11 DIAGNOSIS — H9190 Unspecified hearing loss, unspecified ear: Secondary | ICD-10-CM | POA: Diagnosis not present

## 2022-06-11 DIAGNOSIS — M533 Sacrococcygeal disorders, not elsewhere classified: Secondary | ICD-10-CM | POA: Diagnosis not present

## 2022-06-11 DIAGNOSIS — L409 Psoriasis, unspecified: Secondary | ICD-10-CM | POA: Diagnosis not present

## 2022-06-11 DIAGNOSIS — L308 Other specified dermatitis: Secondary | ICD-10-CM | POA: Diagnosis not present

## 2022-06-11 DIAGNOSIS — G8314 Monoplegia of lower limb affecting left nondominant side: Secondary | ICD-10-CM | POA: Diagnosis not present

## 2022-06-11 DIAGNOSIS — M47815 Spondylosis without myelopathy or radiculopathy, thoracolumbar region: Secondary | ICD-10-CM | POA: Diagnosis not present

## 2022-06-12 DIAGNOSIS — E1159 Type 2 diabetes mellitus with other circulatory complications: Secondary | ICD-10-CM | POA: Diagnosis not present

## 2022-06-12 DIAGNOSIS — I152 Hypertension secondary to endocrine disorders: Secondary | ICD-10-CM | POA: Diagnosis not present

## 2022-06-12 DIAGNOSIS — E782 Mixed hyperlipidemia: Secondary | ICD-10-CM | POA: Diagnosis not present

## 2022-06-12 DIAGNOSIS — E1169 Type 2 diabetes mellitus with other specified complication: Secondary | ICD-10-CM | POA: Diagnosis not present

## 2022-06-14 DIAGNOSIS — I7090 Unspecified atherosclerosis: Secondary | ICD-10-CM | POA: Diagnosis not present

## 2022-06-14 DIAGNOSIS — Z7985 Long-term (current) use of injectable non-insulin antidiabetic drugs: Secondary | ICD-10-CM | POA: Diagnosis not present

## 2022-06-14 DIAGNOSIS — M19011 Primary osteoarthritis, right shoulder: Secondary | ICD-10-CM | POA: Diagnosis not present

## 2022-06-14 DIAGNOSIS — N182 Chronic kidney disease, stage 2 (mild): Secondary | ICD-10-CM | POA: Diagnosis not present

## 2022-06-14 DIAGNOSIS — G8314 Monoplegia of lower limb affecting left nondominant side: Secondary | ICD-10-CM | POA: Diagnosis not present

## 2022-06-14 DIAGNOSIS — H9190 Unspecified hearing loss, unspecified ear: Secondary | ICD-10-CM | POA: Diagnosis not present

## 2022-06-14 DIAGNOSIS — I129 Hypertensive chronic kidney disease with stage 1 through stage 4 chronic kidney disease, or unspecified chronic kidney disease: Secondary | ICD-10-CM | POA: Diagnosis not present

## 2022-06-14 DIAGNOSIS — Z9181 History of falling: Secondary | ICD-10-CM | POA: Diagnosis not present

## 2022-06-14 DIAGNOSIS — M62838 Other muscle spasm: Secondary | ICD-10-CM | POA: Diagnosis not present

## 2022-06-14 DIAGNOSIS — M461 Sacroiliitis, not elsewhere classified: Secondary | ICD-10-CM | POA: Diagnosis not present

## 2022-06-14 DIAGNOSIS — L308 Other specified dermatitis: Secondary | ICD-10-CM | POA: Diagnosis not present

## 2022-06-14 DIAGNOSIS — L409 Psoriasis, unspecified: Secondary | ICD-10-CM | POA: Diagnosis not present

## 2022-06-14 DIAGNOSIS — Z7984 Long term (current) use of oral hypoglycemic drugs: Secondary | ICD-10-CM | POA: Diagnosis not present

## 2022-06-14 DIAGNOSIS — E782 Mixed hyperlipidemia: Secondary | ICD-10-CM | POA: Diagnosis not present

## 2022-06-14 DIAGNOSIS — M438X4 Other specified deforming dorsopathies, thoracic region: Secondary | ICD-10-CM | POA: Diagnosis not present

## 2022-06-14 DIAGNOSIS — M47815 Spondylosis without myelopathy or radiculopathy, thoracolumbar region: Secondary | ICD-10-CM | POA: Diagnosis not present

## 2022-06-14 DIAGNOSIS — M7711 Lateral epicondylitis, right elbow: Secondary | ICD-10-CM | POA: Diagnosis not present

## 2022-06-14 DIAGNOSIS — Z9049 Acquired absence of other specified parts of digestive tract: Secondary | ICD-10-CM | POA: Diagnosis not present

## 2022-06-14 DIAGNOSIS — E1169 Type 2 diabetes mellitus with other specified complication: Secondary | ICD-10-CM | POA: Diagnosis not present

## 2022-06-14 DIAGNOSIS — M533 Sacrococcygeal disorders, not elsewhere classified: Secondary | ICD-10-CM | POA: Diagnosis not present

## 2022-06-14 DIAGNOSIS — E1159 Type 2 diabetes mellitus with other circulatory complications: Secondary | ICD-10-CM | POA: Diagnosis not present

## 2022-06-14 DIAGNOSIS — Z86011 Personal history of benign neoplasm of the brain: Secondary | ICD-10-CM | POA: Diagnosis not present

## 2022-06-18 DIAGNOSIS — Z86011 Personal history of benign neoplasm of the brain: Secondary | ICD-10-CM | POA: Diagnosis not present

## 2022-06-18 DIAGNOSIS — M62838 Other muscle spasm: Secondary | ICD-10-CM | POA: Diagnosis not present

## 2022-06-18 DIAGNOSIS — L409 Psoriasis, unspecified: Secondary | ICD-10-CM | POA: Diagnosis not present

## 2022-06-18 DIAGNOSIS — I129 Hypertensive chronic kidney disease with stage 1 through stage 4 chronic kidney disease, or unspecified chronic kidney disease: Secondary | ICD-10-CM | POA: Diagnosis not present

## 2022-06-18 DIAGNOSIS — E1169 Type 2 diabetes mellitus with other specified complication: Secondary | ICD-10-CM | POA: Diagnosis not present

## 2022-06-18 DIAGNOSIS — N182 Chronic kidney disease, stage 2 (mild): Secondary | ICD-10-CM | POA: Diagnosis not present

## 2022-06-18 DIAGNOSIS — M438X4 Other specified deforming dorsopathies, thoracic region: Secondary | ICD-10-CM | POA: Diagnosis not present

## 2022-06-18 DIAGNOSIS — E782 Mixed hyperlipidemia: Secondary | ICD-10-CM | POA: Diagnosis not present

## 2022-06-18 DIAGNOSIS — I7090 Unspecified atherosclerosis: Secondary | ICD-10-CM | POA: Diagnosis not present

## 2022-06-18 DIAGNOSIS — G8314 Monoplegia of lower limb affecting left nondominant side: Secondary | ICD-10-CM | POA: Diagnosis not present

## 2022-06-18 DIAGNOSIS — M7711 Lateral epicondylitis, right elbow: Secondary | ICD-10-CM | POA: Diagnosis not present

## 2022-06-18 DIAGNOSIS — H9190 Unspecified hearing loss, unspecified ear: Secondary | ICD-10-CM | POA: Diagnosis not present

## 2022-06-18 DIAGNOSIS — M19011 Primary osteoarthritis, right shoulder: Secondary | ICD-10-CM | POA: Diagnosis not present

## 2022-06-18 DIAGNOSIS — Z9181 History of falling: Secondary | ICD-10-CM | POA: Diagnosis not present

## 2022-06-18 DIAGNOSIS — L308 Other specified dermatitis: Secondary | ICD-10-CM | POA: Diagnosis not present

## 2022-06-18 DIAGNOSIS — Z7984 Long term (current) use of oral hypoglycemic drugs: Secondary | ICD-10-CM | POA: Diagnosis not present

## 2022-06-18 DIAGNOSIS — M533 Sacrococcygeal disorders, not elsewhere classified: Secondary | ICD-10-CM | POA: Diagnosis not present

## 2022-06-18 DIAGNOSIS — Z7985 Long-term (current) use of injectable non-insulin antidiabetic drugs: Secondary | ICD-10-CM | POA: Diagnosis not present

## 2022-06-18 DIAGNOSIS — M47815 Spondylosis without myelopathy or radiculopathy, thoracolumbar region: Secondary | ICD-10-CM | POA: Diagnosis not present

## 2022-06-18 DIAGNOSIS — E1159 Type 2 diabetes mellitus with other circulatory complications: Secondary | ICD-10-CM | POA: Diagnosis not present

## 2022-06-18 DIAGNOSIS — Z9049 Acquired absence of other specified parts of digestive tract: Secondary | ICD-10-CM | POA: Diagnosis not present

## 2022-06-18 DIAGNOSIS — M461 Sacroiliitis, not elsewhere classified: Secondary | ICD-10-CM | POA: Diagnosis not present

## 2022-06-21 DIAGNOSIS — E1169 Type 2 diabetes mellitus with other specified complication: Secondary | ICD-10-CM | POA: Diagnosis not present

## 2022-06-21 DIAGNOSIS — M7711 Lateral epicondylitis, right elbow: Secondary | ICD-10-CM | POA: Diagnosis not present

## 2022-06-21 DIAGNOSIS — M47815 Spondylosis without myelopathy or radiculopathy, thoracolumbar region: Secondary | ICD-10-CM | POA: Diagnosis not present

## 2022-06-21 DIAGNOSIS — Z9049 Acquired absence of other specified parts of digestive tract: Secondary | ICD-10-CM | POA: Diagnosis not present

## 2022-06-21 DIAGNOSIS — M533 Sacrococcygeal disorders, not elsewhere classified: Secondary | ICD-10-CM | POA: Diagnosis not present

## 2022-06-21 DIAGNOSIS — G8314 Monoplegia of lower limb affecting left nondominant side: Secondary | ICD-10-CM | POA: Diagnosis not present

## 2022-06-21 DIAGNOSIS — H9190 Unspecified hearing loss, unspecified ear: Secondary | ICD-10-CM | POA: Diagnosis not present

## 2022-06-21 DIAGNOSIS — E782 Mixed hyperlipidemia: Secondary | ICD-10-CM | POA: Diagnosis not present

## 2022-06-21 DIAGNOSIS — L308 Other specified dermatitis: Secondary | ICD-10-CM | POA: Diagnosis not present

## 2022-06-21 DIAGNOSIS — M461 Sacroiliitis, not elsewhere classified: Secondary | ICD-10-CM | POA: Diagnosis not present

## 2022-06-21 DIAGNOSIS — M19011 Primary osteoarthritis, right shoulder: Secondary | ICD-10-CM | POA: Diagnosis not present

## 2022-06-21 DIAGNOSIS — Z7985 Long-term (current) use of injectable non-insulin antidiabetic drugs: Secondary | ICD-10-CM | POA: Diagnosis not present

## 2022-06-21 DIAGNOSIS — Z7984 Long term (current) use of oral hypoglycemic drugs: Secondary | ICD-10-CM | POA: Diagnosis not present

## 2022-06-21 DIAGNOSIS — I7090 Unspecified atherosclerosis: Secondary | ICD-10-CM | POA: Diagnosis not present

## 2022-06-21 DIAGNOSIS — N182 Chronic kidney disease, stage 2 (mild): Secondary | ICD-10-CM | POA: Diagnosis not present

## 2022-06-21 DIAGNOSIS — M438X4 Other specified deforming dorsopathies, thoracic region: Secondary | ICD-10-CM | POA: Diagnosis not present

## 2022-06-21 DIAGNOSIS — Z9181 History of falling: Secondary | ICD-10-CM | POA: Diagnosis not present

## 2022-06-21 DIAGNOSIS — E1159 Type 2 diabetes mellitus with other circulatory complications: Secondary | ICD-10-CM | POA: Diagnosis not present

## 2022-06-21 DIAGNOSIS — M62838 Other muscle spasm: Secondary | ICD-10-CM | POA: Diagnosis not present

## 2022-06-21 DIAGNOSIS — Z86011 Personal history of benign neoplasm of the brain: Secondary | ICD-10-CM | POA: Diagnosis not present

## 2022-06-21 DIAGNOSIS — I129 Hypertensive chronic kidney disease with stage 1 through stage 4 chronic kidney disease, or unspecified chronic kidney disease: Secondary | ICD-10-CM | POA: Diagnosis not present

## 2022-06-21 DIAGNOSIS — L409 Psoriasis, unspecified: Secondary | ICD-10-CM | POA: Diagnosis not present

## 2022-06-28 DIAGNOSIS — L409 Psoriasis, unspecified: Secondary | ICD-10-CM | POA: Diagnosis not present

## 2022-06-28 DIAGNOSIS — Z9049 Acquired absence of other specified parts of digestive tract: Secondary | ICD-10-CM | POA: Diagnosis not present

## 2022-06-28 DIAGNOSIS — L308 Other specified dermatitis: Secondary | ICD-10-CM | POA: Diagnosis not present

## 2022-06-28 DIAGNOSIS — Z7985 Long-term (current) use of injectable non-insulin antidiabetic drugs: Secondary | ICD-10-CM | POA: Diagnosis not present

## 2022-06-28 DIAGNOSIS — I7090 Unspecified atherosclerosis: Secondary | ICD-10-CM | POA: Diagnosis not present

## 2022-06-28 DIAGNOSIS — H9190 Unspecified hearing loss, unspecified ear: Secondary | ICD-10-CM | POA: Diagnosis not present

## 2022-06-28 DIAGNOSIS — M461 Sacroiliitis, not elsewhere classified: Secondary | ICD-10-CM | POA: Diagnosis not present

## 2022-06-28 DIAGNOSIS — Z7984 Long term (current) use of oral hypoglycemic drugs: Secondary | ICD-10-CM | POA: Diagnosis not present

## 2022-06-28 DIAGNOSIS — M533 Sacrococcygeal disorders, not elsewhere classified: Secondary | ICD-10-CM | POA: Diagnosis not present

## 2022-06-28 DIAGNOSIS — M7711 Lateral epicondylitis, right elbow: Secondary | ICD-10-CM | POA: Diagnosis not present

## 2022-06-28 DIAGNOSIS — M19011 Primary osteoarthritis, right shoulder: Secondary | ICD-10-CM | POA: Diagnosis not present

## 2022-06-28 DIAGNOSIS — I129 Hypertensive chronic kidney disease with stage 1 through stage 4 chronic kidney disease, or unspecified chronic kidney disease: Secondary | ICD-10-CM | POA: Diagnosis not present

## 2022-06-28 DIAGNOSIS — E782 Mixed hyperlipidemia: Secondary | ICD-10-CM | POA: Diagnosis not present

## 2022-06-28 DIAGNOSIS — M47815 Spondylosis without myelopathy or radiculopathy, thoracolumbar region: Secondary | ICD-10-CM | POA: Diagnosis not present

## 2022-06-28 DIAGNOSIS — Z86011 Personal history of benign neoplasm of the brain: Secondary | ICD-10-CM | POA: Diagnosis not present

## 2022-06-28 DIAGNOSIS — Z9181 History of falling: Secondary | ICD-10-CM | POA: Diagnosis not present

## 2022-06-28 DIAGNOSIS — G8314 Monoplegia of lower limb affecting left nondominant side: Secondary | ICD-10-CM | POA: Diagnosis not present

## 2022-06-28 DIAGNOSIS — M438X4 Other specified deforming dorsopathies, thoracic region: Secondary | ICD-10-CM | POA: Diagnosis not present

## 2022-06-28 DIAGNOSIS — N182 Chronic kidney disease, stage 2 (mild): Secondary | ICD-10-CM | POA: Diagnosis not present

## 2022-06-28 DIAGNOSIS — E1159 Type 2 diabetes mellitus with other circulatory complications: Secondary | ICD-10-CM | POA: Diagnosis not present

## 2022-06-28 DIAGNOSIS — M62838 Other muscle spasm: Secondary | ICD-10-CM | POA: Diagnosis not present

## 2022-06-28 DIAGNOSIS — E1169 Type 2 diabetes mellitus with other specified complication: Secondary | ICD-10-CM | POA: Diagnosis not present

## 2022-07-02 DIAGNOSIS — L409 Psoriasis, unspecified: Secondary | ICD-10-CM | POA: Diagnosis not present

## 2022-07-02 DIAGNOSIS — E1159 Type 2 diabetes mellitus with other circulatory complications: Secondary | ICD-10-CM | POA: Diagnosis not present

## 2022-07-02 DIAGNOSIS — M62838 Other muscle spasm: Secondary | ICD-10-CM | POA: Diagnosis not present

## 2022-07-02 DIAGNOSIS — Z7984 Long term (current) use of oral hypoglycemic drugs: Secondary | ICD-10-CM | POA: Diagnosis not present

## 2022-07-02 DIAGNOSIS — H9190 Unspecified hearing loss, unspecified ear: Secondary | ICD-10-CM | POA: Diagnosis not present

## 2022-07-02 DIAGNOSIS — M533 Sacrococcygeal disorders, not elsewhere classified: Secondary | ICD-10-CM | POA: Diagnosis not present

## 2022-07-02 DIAGNOSIS — M47815 Spondylosis without myelopathy or radiculopathy, thoracolumbar region: Secondary | ICD-10-CM | POA: Diagnosis not present

## 2022-07-02 DIAGNOSIS — E1169 Type 2 diabetes mellitus with other specified complication: Secondary | ICD-10-CM | POA: Diagnosis not present

## 2022-07-02 DIAGNOSIS — G8314 Monoplegia of lower limb affecting left nondominant side: Secondary | ICD-10-CM | POA: Diagnosis not present

## 2022-07-02 DIAGNOSIS — Z9049 Acquired absence of other specified parts of digestive tract: Secondary | ICD-10-CM | POA: Diagnosis not present

## 2022-07-02 DIAGNOSIS — I7090 Unspecified atherosclerosis: Secondary | ICD-10-CM | POA: Diagnosis not present

## 2022-07-02 DIAGNOSIS — M19011 Primary osteoarthritis, right shoulder: Secondary | ICD-10-CM | POA: Diagnosis not present

## 2022-07-02 DIAGNOSIS — Z86011 Personal history of benign neoplasm of the brain: Secondary | ICD-10-CM | POA: Diagnosis not present

## 2022-07-02 DIAGNOSIS — M7711 Lateral epicondylitis, right elbow: Secondary | ICD-10-CM | POA: Diagnosis not present

## 2022-07-02 DIAGNOSIS — Z9181 History of falling: Secondary | ICD-10-CM | POA: Diagnosis not present

## 2022-07-02 DIAGNOSIS — I129 Hypertensive chronic kidney disease with stage 1 through stage 4 chronic kidney disease, or unspecified chronic kidney disease: Secondary | ICD-10-CM | POA: Diagnosis not present

## 2022-07-02 DIAGNOSIS — E782 Mixed hyperlipidemia: Secondary | ICD-10-CM | POA: Diagnosis not present

## 2022-07-02 DIAGNOSIS — Z7985 Long-term (current) use of injectable non-insulin antidiabetic drugs: Secondary | ICD-10-CM | POA: Diagnosis not present

## 2022-07-02 DIAGNOSIS — N182 Chronic kidney disease, stage 2 (mild): Secondary | ICD-10-CM | POA: Diagnosis not present

## 2022-07-02 DIAGNOSIS — L308 Other specified dermatitis: Secondary | ICD-10-CM | POA: Diagnosis not present

## 2022-07-02 DIAGNOSIS — M438X4 Other specified deforming dorsopathies, thoracic region: Secondary | ICD-10-CM | POA: Diagnosis not present

## 2022-07-02 DIAGNOSIS — M461 Sacroiliitis, not elsewhere classified: Secondary | ICD-10-CM | POA: Diagnosis not present

## 2022-07-05 DIAGNOSIS — M7711 Lateral epicondylitis, right elbow: Secondary | ICD-10-CM | POA: Diagnosis not present

## 2022-07-05 DIAGNOSIS — Z86011 Personal history of benign neoplasm of the brain: Secondary | ICD-10-CM | POA: Diagnosis not present

## 2022-07-05 DIAGNOSIS — M19011 Primary osteoarthritis, right shoulder: Secondary | ICD-10-CM | POA: Diagnosis not present

## 2022-07-05 DIAGNOSIS — I7090 Unspecified atherosclerosis: Secondary | ICD-10-CM | POA: Diagnosis not present

## 2022-07-05 DIAGNOSIS — H9190 Unspecified hearing loss, unspecified ear: Secondary | ICD-10-CM | POA: Diagnosis not present

## 2022-07-05 DIAGNOSIS — Z9049 Acquired absence of other specified parts of digestive tract: Secondary | ICD-10-CM | POA: Diagnosis not present

## 2022-07-05 DIAGNOSIS — M533 Sacrococcygeal disorders, not elsewhere classified: Secondary | ICD-10-CM | POA: Diagnosis not present

## 2022-07-05 DIAGNOSIS — E1159 Type 2 diabetes mellitus with other circulatory complications: Secondary | ICD-10-CM | POA: Diagnosis not present

## 2022-07-05 DIAGNOSIS — G8314 Monoplegia of lower limb affecting left nondominant side: Secondary | ICD-10-CM | POA: Diagnosis not present

## 2022-07-05 DIAGNOSIS — Z7985 Long-term (current) use of injectable non-insulin antidiabetic drugs: Secondary | ICD-10-CM | POA: Diagnosis not present

## 2022-07-05 DIAGNOSIS — L308 Other specified dermatitis: Secondary | ICD-10-CM | POA: Diagnosis not present

## 2022-07-05 DIAGNOSIS — E782 Mixed hyperlipidemia: Secondary | ICD-10-CM | POA: Diagnosis not present

## 2022-07-05 DIAGNOSIS — M47815 Spondylosis without myelopathy or radiculopathy, thoracolumbar region: Secondary | ICD-10-CM | POA: Diagnosis not present

## 2022-07-05 DIAGNOSIS — N182 Chronic kidney disease, stage 2 (mild): Secondary | ICD-10-CM | POA: Diagnosis not present

## 2022-07-05 DIAGNOSIS — L409 Psoriasis, unspecified: Secondary | ICD-10-CM | POA: Diagnosis not present

## 2022-07-05 DIAGNOSIS — M461 Sacroiliitis, not elsewhere classified: Secondary | ICD-10-CM | POA: Diagnosis not present

## 2022-07-05 DIAGNOSIS — M438X4 Other specified deforming dorsopathies, thoracic region: Secondary | ICD-10-CM | POA: Diagnosis not present

## 2022-07-05 DIAGNOSIS — I129 Hypertensive chronic kidney disease with stage 1 through stage 4 chronic kidney disease, or unspecified chronic kidney disease: Secondary | ICD-10-CM | POA: Diagnosis not present

## 2022-07-05 DIAGNOSIS — E1169 Type 2 diabetes mellitus with other specified complication: Secondary | ICD-10-CM | POA: Diagnosis not present

## 2022-07-05 DIAGNOSIS — Z9181 History of falling: Secondary | ICD-10-CM | POA: Diagnosis not present

## 2022-07-05 DIAGNOSIS — Z7984 Long term (current) use of oral hypoglycemic drugs: Secondary | ICD-10-CM | POA: Diagnosis not present

## 2022-07-05 DIAGNOSIS — M62838 Other muscle spasm: Secondary | ICD-10-CM | POA: Diagnosis not present

## 2022-07-09 DIAGNOSIS — L308 Other specified dermatitis: Secondary | ICD-10-CM | POA: Diagnosis not present

## 2022-07-09 DIAGNOSIS — M533 Sacrococcygeal disorders, not elsewhere classified: Secondary | ICD-10-CM | POA: Diagnosis not present

## 2022-07-09 DIAGNOSIS — M7711 Lateral epicondylitis, right elbow: Secondary | ICD-10-CM | POA: Diagnosis not present

## 2022-07-09 DIAGNOSIS — E782 Mixed hyperlipidemia: Secondary | ICD-10-CM | POA: Diagnosis not present

## 2022-07-09 DIAGNOSIS — M19011 Primary osteoarthritis, right shoulder: Secondary | ICD-10-CM | POA: Diagnosis not present

## 2022-07-09 DIAGNOSIS — E1159 Type 2 diabetes mellitus with other circulatory complications: Secondary | ICD-10-CM | POA: Diagnosis not present

## 2022-07-09 DIAGNOSIS — M438X4 Other specified deforming dorsopathies, thoracic region: Secondary | ICD-10-CM | POA: Diagnosis not present

## 2022-07-09 DIAGNOSIS — M62838 Other muscle spasm: Secondary | ICD-10-CM | POA: Diagnosis not present

## 2022-07-09 DIAGNOSIS — H9190 Unspecified hearing loss, unspecified ear: Secondary | ICD-10-CM | POA: Diagnosis not present

## 2022-07-09 DIAGNOSIS — Z9181 History of falling: Secondary | ICD-10-CM | POA: Diagnosis not present

## 2022-07-09 DIAGNOSIS — E1169 Type 2 diabetes mellitus with other specified complication: Secondary | ICD-10-CM | POA: Diagnosis not present

## 2022-07-09 DIAGNOSIS — M47815 Spondylosis without myelopathy or radiculopathy, thoracolumbar region: Secondary | ICD-10-CM | POA: Diagnosis not present

## 2022-07-09 DIAGNOSIS — Z9049 Acquired absence of other specified parts of digestive tract: Secondary | ICD-10-CM | POA: Diagnosis not present

## 2022-07-09 DIAGNOSIS — I129 Hypertensive chronic kidney disease with stage 1 through stage 4 chronic kidney disease, or unspecified chronic kidney disease: Secondary | ICD-10-CM | POA: Diagnosis not present

## 2022-07-09 DIAGNOSIS — M461 Sacroiliitis, not elsewhere classified: Secondary | ICD-10-CM | POA: Diagnosis not present

## 2022-07-09 DIAGNOSIS — Z86011 Personal history of benign neoplasm of the brain: Secondary | ICD-10-CM | POA: Diagnosis not present

## 2022-07-09 DIAGNOSIS — G8314 Monoplegia of lower limb affecting left nondominant side: Secondary | ICD-10-CM | POA: Diagnosis not present

## 2022-07-09 DIAGNOSIS — Z7985 Long-term (current) use of injectable non-insulin antidiabetic drugs: Secondary | ICD-10-CM | POA: Diagnosis not present

## 2022-07-09 DIAGNOSIS — N182 Chronic kidney disease, stage 2 (mild): Secondary | ICD-10-CM | POA: Diagnosis not present

## 2022-07-09 DIAGNOSIS — L409 Psoriasis, unspecified: Secondary | ICD-10-CM | POA: Diagnosis not present

## 2022-07-09 DIAGNOSIS — I7090 Unspecified atherosclerosis: Secondary | ICD-10-CM | POA: Diagnosis not present

## 2022-07-09 DIAGNOSIS — Z7984 Long term (current) use of oral hypoglycemic drugs: Secondary | ICD-10-CM | POA: Diagnosis not present

## 2022-07-12 DIAGNOSIS — E1159 Type 2 diabetes mellitus with other circulatory complications: Secondary | ICD-10-CM | POA: Diagnosis not present

## 2022-07-12 DIAGNOSIS — Z9181 History of falling: Secondary | ICD-10-CM | POA: Diagnosis not present

## 2022-07-12 DIAGNOSIS — I129 Hypertensive chronic kidney disease with stage 1 through stage 4 chronic kidney disease, or unspecified chronic kidney disease: Secondary | ICD-10-CM | POA: Diagnosis not present

## 2022-07-12 DIAGNOSIS — G8314 Monoplegia of lower limb affecting left nondominant side: Secondary | ICD-10-CM | POA: Diagnosis not present

## 2022-07-12 DIAGNOSIS — I7090 Unspecified atherosclerosis: Secondary | ICD-10-CM | POA: Diagnosis not present

## 2022-07-12 DIAGNOSIS — M533 Sacrococcygeal disorders, not elsewhere classified: Secondary | ICD-10-CM | POA: Diagnosis not present

## 2022-07-12 DIAGNOSIS — M62838 Other muscle spasm: Secondary | ICD-10-CM | POA: Diagnosis not present

## 2022-07-12 DIAGNOSIS — Z86011 Personal history of benign neoplasm of the brain: Secondary | ICD-10-CM | POA: Diagnosis not present

## 2022-07-12 DIAGNOSIS — Z7985 Long-term (current) use of injectable non-insulin antidiabetic drugs: Secondary | ICD-10-CM | POA: Diagnosis not present

## 2022-07-12 DIAGNOSIS — L308 Other specified dermatitis: Secondary | ICD-10-CM | POA: Diagnosis not present

## 2022-07-12 DIAGNOSIS — M19011 Primary osteoarthritis, right shoulder: Secondary | ICD-10-CM | POA: Diagnosis not present

## 2022-07-12 DIAGNOSIS — M461 Sacroiliitis, not elsewhere classified: Secondary | ICD-10-CM | POA: Diagnosis not present

## 2022-07-12 DIAGNOSIS — M7711 Lateral epicondylitis, right elbow: Secondary | ICD-10-CM | POA: Diagnosis not present

## 2022-07-12 DIAGNOSIS — H9190 Unspecified hearing loss, unspecified ear: Secondary | ICD-10-CM | POA: Diagnosis not present

## 2022-07-12 DIAGNOSIS — M438X4 Other specified deforming dorsopathies, thoracic region: Secondary | ICD-10-CM | POA: Diagnosis not present

## 2022-07-12 DIAGNOSIS — L409 Psoriasis, unspecified: Secondary | ICD-10-CM | POA: Diagnosis not present

## 2022-07-12 DIAGNOSIS — E1169 Type 2 diabetes mellitus with other specified complication: Secondary | ICD-10-CM | POA: Diagnosis not present

## 2022-07-12 DIAGNOSIS — Z9049 Acquired absence of other specified parts of digestive tract: Secondary | ICD-10-CM | POA: Diagnosis not present

## 2022-07-12 DIAGNOSIS — Z7984 Long term (current) use of oral hypoglycemic drugs: Secondary | ICD-10-CM | POA: Diagnosis not present

## 2022-07-12 DIAGNOSIS — E782 Mixed hyperlipidemia: Secondary | ICD-10-CM | POA: Diagnosis not present

## 2022-07-12 DIAGNOSIS — N182 Chronic kidney disease, stage 2 (mild): Secondary | ICD-10-CM | POA: Diagnosis not present

## 2022-07-12 DIAGNOSIS — M47815 Spondylosis without myelopathy or radiculopathy, thoracolumbar region: Secondary | ICD-10-CM | POA: Diagnosis not present

## 2022-07-15 DIAGNOSIS — L602 Onychogryphosis: Secondary | ICD-10-CM | POA: Diagnosis not present

## 2022-07-15 DIAGNOSIS — Z7984 Long term (current) use of oral hypoglycemic drugs: Secondary | ICD-10-CM | POA: Diagnosis not present

## 2022-07-15 DIAGNOSIS — Z7985 Long-term (current) use of injectable non-insulin antidiabetic drugs: Secondary | ICD-10-CM | POA: Diagnosis not present

## 2022-07-15 DIAGNOSIS — E119 Type 2 diabetes mellitus without complications: Secondary | ICD-10-CM | POA: Diagnosis not present

## 2022-07-16 DIAGNOSIS — H9190 Unspecified hearing loss, unspecified ear: Secondary | ICD-10-CM | POA: Diagnosis not present

## 2022-07-16 DIAGNOSIS — I7090 Unspecified atherosclerosis: Secondary | ICD-10-CM | POA: Diagnosis not present

## 2022-07-16 DIAGNOSIS — M461 Sacroiliitis, not elsewhere classified: Secondary | ICD-10-CM | POA: Diagnosis not present

## 2022-07-16 DIAGNOSIS — M19011 Primary osteoarthritis, right shoulder: Secondary | ICD-10-CM | POA: Diagnosis not present

## 2022-07-16 DIAGNOSIS — M62838 Other muscle spasm: Secondary | ICD-10-CM | POA: Diagnosis not present

## 2022-07-16 DIAGNOSIS — E782 Mixed hyperlipidemia: Secondary | ICD-10-CM | POA: Diagnosis not present

## 2022-07-16 DIAGNOSIS — M533 Sacrococcygeal disorders, not elsewhere classified: Secondary | ICD-10-CM | POA: Diagnosis not present

## 2022-07-16 DIAGNOSIS — L308 Other specified dermatitis: Secondary | ICD-10-CM | POA: Diagnosis not present

## 2022-07-16 DIAGNOSIS — E1169 Type 2 diabetes mellitus with other specified complication: Secondary | ICD-10-CM | POA: Diagnosis not present

## 2022-07-16 DIAGNOSIS — E1159 Type 2 diabetes mellitus with other circulatory complications: Secondary | ICD-10-CM | POA: Diagnosis not present

## 2022-07-16 DIAGNOSIS — M47815 Spondylosis without myelopathy or radiculopathy, thoracolumbar region: Secondary | ICD-10-CM | POA: Diagnosis not present

## 2022-07-16 DIAGNOSIS — L409 Psoriasis, unspecified: Secondary | ICD-10-CM | POA: Diagnosis not present

## 2022-07-16 DIAGNOSIS — Z7985 Long-term (current) use of injectable non-insulin antidiabetic drugs: Secondary | ICD-10-CM | POA: Diagnosis not present

## 2022-07-16 DIAGNOSIS — M7711 Lateral epicondylitis, right elbow: Secondary | ICD-10-CM | POA: Diagnosis not present

## 2022-07-16 DIAGNOSIS — Z9181 History of falling: Secondary | ICD-10-CM | POA: Diagnosis not present

## 2022-07-16 DIAGNOSIS — Z9049 Acquired absence of other specified parts of digestive tract: Secondary | ICD-10-CM | POA: Diagnosis not present

## 2022-07-16 DIAGNOSIS — G8314 Monoplegia of lower limb affecting left nondominant side: Secondary | ICD-10-CM | POA: Diagnosis not present

## 2022-07-16 DIAGNOSIS — M438X4 Other specified deforming dorsopathies, thoracic region: Secondary | ICD-10-CM | POA: Diagnosis not present

## 2022-07-16 DIAGNOSIS — N182 Chronic kidney disease, stage 2 (mild): Secondary | ICD-10-CM | POA: Diagnosis not present

## 2022-07-16 DIAGNOSIS — Z86011 Personal history of benign neoplasm of the brain: Secondary | ICD-10-CM | POA: Diagnosis not present

## 2022-07-16 DIAGNOSIS — Z7984 Long term (current) use of oral hypoglycemic drugs: Secondary | ICD-10-CM | POA: Diagnosis not present

## 2022-07-16 DIAGNOSIS — I129 Hypertensive chronic kidney disease with stage 1 through stage 4 chronic kidney disease, or unspecified chronic kidney disease: Secondary | ICD-10-CM | POA: Diagnosis not present

## 2022-07-18 DIAGNOSIS — Z7985 Long-term (current) use of injectable non-insulin antidiabetic drugs: Secondary | ICD-10-CM | POA: Diagnosis not present

## 2022-07-18 DIAGNOSIS — M533 Sacrococcygeal disorders, not elsewhere classified: Secondary | ICD-10-CM | POA: Diagnosis not present

## 2022-07-18 DIAGNOSIS — G8314 Monoplegia of lower limb affecting left nondominant side: Secondary | ICD-10-CM | POA: Diagnosis not present

## 2022-07-18 DIAGNOSIS — M62838 Other muscle spasm: Secondary | ICD-10-CM | POA: Diagnosis not present

## 2022-07-18 DIAGNOSIS — M7711 Lateral epicondylitis, right elbow: Secondary | ICD-10-CM | POA: Diagnosis not present

## 2022-07-18 DIAGNOSIS — I129 Hypertensive chronic kidney disease with stage 1 through stage 4 chronic kidney disease, or unspecified chronic kidney disease: Secondary | ICD-10-CM | POA: Diagnosis not present

## 2022-07-18 DIAGNOSIS — I7090 Unspecified atherosclerosis: Secondary | ICD-10-CM | POA: Diagnosis not present

## 2022-07-18 DIAGNOSIS — E782 Mixed hyperlipidemia: Secondary | ICD-10-CM | POA: Diagnosis not present

## 2022-07-18 DIAGNOSIS — L308 Other specified dermatitis: Secondary | ICD-10-CM | POA: Diagnosis not present

## 2022-07-18 DIAGNOSIS — N182 Chronic kidney disease, stage 2 (mild): Secondary | ICD-10-CM | POA: Diagnosis not present

## 2022-07-18 DIAGNOSIS — H9190 Unspecified hearing loss, unspecified ear: Secondary | ICD-10-CM | POA: Diagnosis not present

## 2022-07-18 DIAGNOSIS — M461 Sacroiliitis, not elsewhere classified: Secondary | ICD-10-CM | POA: Diagnosis not present

## 2022-07-18 DIAGNOSIS — Z9181 History of falling: Secondary | ICD-10-CM | POA: Diagnosis not present

## 2022-07-18 DIAGNOSIS — Z7984 Long term (current) use of oral hypoglycemic drugs: Secondary | ICD-10-CM | POA: Diagnosis not present

## 2022-07-18 DIAGNOSIS — Z86011 Personal history of benign neoplasm of the brain: Secondary | ICD-10-CM | POA: Diagnosis not present

## 2022-07-18 DIAGNOSIS — M19011 Primary osteoarthritis, right shoulder: Secondary | ICD-10-CM | POA: Diagnosis not present

## 2022-07-18 DIAGNOSIS — M47815 Spondylosis without myelopathy or radiculopathy, thoracolumbar region: Secondary | ICD-10-CM | POA: Diagnosis not present

## 2022-07-18 DIAGNOSIS — E1169 Type 2 diabetes mellitus with other specified complication: Secondary | ICD-10-CM | POA: Diagnosis not present

## 2022-07-18 DIAGNOSIS — Z9049 Acquired absence of other specified parts of digestive tract: Secondary | ICD-10-CM | POA: Diagnosis not present

## 2022-07-18 DIAGNOSIS — M438X4 Other specified deforming dorsopathies, thoracic region: Secondary | ICD-10-CM | POA: Diagnosis not present

## 2022-07-18 DIAGNOSIS — E1159 Type 2 diabetes mellitus with other circulatory complications: Secondary | ICD-10-CM | POA: Diagnosis not present

## 2022-07-18 DIAGNOSIS — L409 Psoriasis, unspecified: Secondary | ICD-10-CM | POA: Diagnosis not present

## 2022-07-19 DIAGNOSIS — M8589 Other specified disorders of bone density and structure, multiple sites: Secondary | ICD-10-CM | POA: Diagnosis not present

## 2022-07-19 DIAGNOSIS — Z1231 Encounter for screening mammogram for malignant neoplasm of breast: Secondary | ICD-10-CM | POA: Diagnosis not present

## 2022-07-23 DIAGNOSIS — Z7984 Long term (current) use of oral hypoglycemic drugs: Secondary | ICD-10-CM | POA: Diagnosis not present

## 2022-07-23 DIAGNOSIS — E1169 Type 2 diabetes mellitus with other specified complication: Secondary | ICD-10-CM | POA: Diagnosis not present

## 2022-07-23 DIAGNOSIS — E1159 Type 2 diabetes mellitus with other circulatory complications: Secondary | ICD-10-CM | POA: Diagnosis not present

## 2022-07-23 DIAGNOSIS — E782 Mixed hyperlipidemia: Secondary | ICD-10-CM | POA: Diagnosis not present

## 2022-07-23 DIAGNOSIS — Z86011 Personal history of benign neoplasm of the brain: Secondary | ICD-10-CM | POA: Diagnosis not present

## 2022-07-23 DIAGNOSIS — M47815 Spondylosis without myelopathy or radiculopathy, thoracolumbar region: Secondary | ICD-10-CM | POA: Diagnosis not present

## 2022-07-23 DIAGNOSIS — G8314 Monoplegia of lower limb affecting left nondominant side: Secondary | ICD-10-CM | POA: Diagnosis not present

## 2022-07-23 DIAGNOSIS — I7090 Unspecified atherosclerosis: Secondary | ICD-10-CM | POA: Diagnosis not present

## 2022-07-23 DIAGNOSIS — L409 Psoriasis, unspecified: Secondary | ICD-10-CM | POA: Diagnosis not present

## 2022-07-23 DIAGNOSIS — M533 Sacrococcygeal disorders, not elsewhere classified: Secondary | ICD-10-CM | POA: Diagnosis not present

## 2022-07-23 DIAGNOSIS — M7711 Lateral epicondylitis, right elbow: Secondary | ICD-10-CM | POA: Diagnosis not present

## 2022-07-23 DIAGNOSIS — M62838 Other muscle spasm: Secondary | ICD-10-CM | POA: Diagnosis not present

## 2022-07-23 DIAGNOSIS — N182 Chronic kidney disease, stage 2 (mild): Secondary | ICD-10-CM | POA: Diagnosis not present

## 2022-07-23 DIAGNOSIS — I129 Hypertensive chronic kidney disease with stage 1 through stage 4 chronic kidney disease, or unspecified chronic kidney disease: Secondary | ICD-10-CM | POA: Diagnosis not present

## 2022-07-23 DIAGNOSIS — H9190 Unspecified hearing loss, unspecified ear: Secondary | ICD-10-CM | POA: Diagnosis not present

## 2022-07-23 DIAGNOSIS — Z9049 Acquired absence of other specified parts of digestive tract: Secondary | ICD-10-CM | POA: Diagnosis not present

## 2022-07-23 DIAGNOSIS — M461 Sacroiliitis, not elsewhere classified: Secondary | ICD-10-CM | POA: Diagnosis not present

## 2022-07-23 DIAGNOSIS — M19011 Primary osteoarthritis, right shoulder: Secondary | ICD-10-CM | POA: Diagnosis not present

## 2022-07-23 DIAGNOSIS — Z9181 History of falling: Secondary | ICD-10-CM | POA: Diagnosis not present

## 2022-07-23 DIAGNOSIS — M438X4 Other specified deforming dorsopathies, thoracic region: Secondary | ICD-10-CM | POA: Diagnosis not present

## 2022-07-23 DIAGNOSIS — L308 Other specified dermatitis: Secondary | ICD-10-CM | POA: Diagnosis not present

## 2022-07-23 DIAGNOSIS — Z7985 Long-term (current) use of injectable non-insulin antidiabetic drugs: Secondary | ICD-10-CM | POA: Diagnosis not present

## 2022-07-25 DIAGNOSIS — H9190 Unspecified hearing loss, unspecified ear: Secondary | ICD-10-CM | POA: Diagnosis not present

## 2022-07-25 DIAGNOSIS — M19011 Primary osteoarthritis, right shoulder: Secondary | ICD-10-CM | POA: Diagnosis not present

## 2022-07-25 DIAGNOSIS — M7711 Lateral epicondylitis, right elbow: Secondary | ICD-10-CM | POA: Diagnosis not present

## 2022-07-25 DIAGNOSIS — Z7985 Long-term (current) use of injectable non-insulin antidiabetic drugs: Secondary | ICD-10-CM | POA: Diagnosis not present

## 2022-07-25 DIAGNOSIS — M62838 Other muscle spasm: Secondary | ICD-10-CM | POA: Diagnosis not present

## 2022-07-25 DIAGNOSIS — E1169 Type 2 diabetes mellitus with other specified complication: Secondary | ICD-10-CM | POA: Diagnosis not present

## 2022-07-25 DIAGNOSIS — M461 Sacroiliitis, not elsewhere classified: Secondary | ICD-10-CM | POA: Diagnosis not present

## 2022-07-25 DIAGNOSIS — M533 Sacrococcygeal disorders, not elsewhere classified: Secondary | ICD-10-CM | POA: Diagnosis not present

## 2022-07-25 DIAGNOSIS — Z9049 Acquired absence of other specified parts of digestive tract: Secondary | ICD-10-CM | POA: Diagnosis not present

## 2022-07-25 DIAGNOSIS — I7090 Unspecified atherosclerosis: Secondary | ICD-10-CM | POA: Diagnosis not present

## 2022-07-25 DIAGNOSIS — N182 Chronic kidney disease, stage 2 (mild): Secondary | ICD-10-CM | POA: Diagnosis not present

## 2022-07-25 DIAGNOSIS — Z7984 Long term (current) use of oral hypoglycemic drugs: Secondary | ICD-10-CM | POA: Diagnosis not present

## 2022-07-25 DIAGNOSIS — E1159 Type 2 diabetes mellitus with other circulatory complications: Secondary | ICD-10-CM | POA: Diagnosis not present

## 2022-07-25 DIAGNOSIS — G8314 Monoplegia of lower limb affecting left nondominant side: Secondary | ICD-10-CM | POA: Diagnosis not present

## 2022-07-25 DIAGNOSIS — I129 Hypertensive chronic kidney disease with stage 1 through stage 4 chronic kidney disease, or unspecified chronic kidney disease: Secondary | ICD-10-CM | POA: Diagnosis not present

## 2022-07-25 DIAGNOSIS — M47815 Spondylosis without myelopathy or radiculopathy, thoracolumbar region: Secondary | ICD-10-CM | POA: Diagnosis not present

## 2022-07-25 DIAGNOSIS — Z9181 History of falling: Secondary | ICD-10-CM | POA: Diagnosis not present

## 2022-07-25 DIAGNOSIS — Z86011 Personal history of benign neoplasm of the brain: Secondary | ICD-10-CM | POA: Diagnosis not present

## 2022-07-25 DIAGNOSIS — L409 Psoriasis, unspecified: Secondary | ICD-10-CM | POA: Diagnosis not present

## 2022-07-25 DIAGNOSIS — E782 Mixed hyperlipidemia: Secondary | ICD-10-CM | POA: Diagnosis not present

## 2022-07-25 DIAGNOSIS — L308 Other specified dermatitis: Secondary | ICD-10-CM | POA: Diagnosis not present

## 2022-07-25 DIAGNOSIS — M438X4 Other specified deforming dorsopathies, thoracic region: Secondary | ICD-10-CM | POA: Diagnosis not present

## 2022-07-30 DIAGNOSIS — D420 Neoplasm of uncertain behavior of cerebral meninges: Secondary | ICD-10-CM | POA: Diagnosis not present

## 2022-07-30 DIAGNOSIS — D421 Neoplasm of uncertain behavior of spinal meninges: Secondary | ICD-10-CM | POA: Diagnosis not present

## 2022-07-30 DIAGNOSIS — R42 Dizziness and giddiness: Secondary | ICD-10-CM | POA: Diagnosis not present

## 2022-08-02 DIAGNOSIS — Z9181 History of falling: Secondary | ICD-10-CM | POA: Diagnosis not present

## 2022-08-02 DIAGNOSIS — Z7984 Long term (current) use of oral hypoglycemic drugs: Secondary | ICD-10-CM | POA: Diagnosis not present

## 2022-08-02 DIAGNOSIS — H9190 Unspecified hearing loss, unspecified ear: Secondary | ICD-10-CM | POA: Diagnosis not present

## 2022-08-02 DIAGNOSIS — G8314 Monoplegia of lower limb affecting left nondominant side: Secondary | ICD-10-CM | POA: Diagnosis not present

## 2022-08-02 DIAGNOSIS — E782 Mixed hyperlipidemia: Secondary | ICD-10-CM | POA: Diagnosis not present

## 2022-08-02 DIAGNOSIS — M62838 Other muscle spasm: Secondary | ICD-10-CM | POA: Diagnosis not present

## 2022-08-02 DIAGNOSIS — M19011 Primary osteoarthritis, right shoulder: Secondary | ICD-10-CM | POA: Diagnosis not present

## 2022-08-02 DIAGNOSIS — N182 Chronic kidney disease, stage 2 (mild): Secondary | ICD-10-CM | POA: Diagnosis not present

## 2022-08-02 DIAGNOSIS — E1169 Type 2 diabetes mellitus with other specified complication: Secondary | ICD-10-CM | POA: Diagnosis not present

## 2022-08-02 DIAGNOSIS — L308 Other specified dermatitis: Secondary | ICD-10-CM | POA: Diagnosis not present

## 2022-08-02 DIAGNOSIS — M461 Sacroiliitis, not elsewhere classified: Secondary | ICD-10-CM | POA: Diagnosis not present

## 2022-08-02 DIAGNOSIS — M7711 Lateral epicondylitis, right elbow: Secondary | ICD-10-CM | POA: Diagnosis not present

## 2022-08-02 DIAGNOSIS — Z9049 Acquired absence of other specified parts of digestive tract: Secondary | ICD-10-CM | POA: Diagnosis not present

## 2022-08-02 DIAGNOSIS — M533 Sacrococcygeal disorders, not elsewhere classified: Secondary | ICD-10-CM | POA: Diagnosis not present

## 2022-08-02 DIAGNOSIS — I7090 Unspecified atherosclerosis: Secondary | ICD-10-CM | POA: Diagnosis not present

## 2022-08-02 DIAGNOSIS — Z86011 Personal history of benign neoplasm of the brain: Secondary | ICD-10-CM | POA: Diagnosis not present

## 2022-08-02 DIAGNOSIS — M47815 Spondylosis without myelopathy or radiculopathy, thoracolumbar region: Secondary | ICD-10-CM | POA: Diagnosis not present

## 2022-08-02 DIAGNOSIS — E1159 Type 2 diabetes mellitus with other circulatory complications: Secondary | ICD-10-CM | POA: Diagnosis not present

## 2022-08-02 DIAGNOSIS — L409 Psoriasis, unspecified: Secondary | ICD-10-CM | POA: Diagnosis not present

## 2022-08-02 DIAGNOSIS — M438X4 Other specified deforming dorsopathies, thoracic region: Secondary | ICD-10-CM | POA: Diagnosis not present

## 2022-08-02 DIAGNOSIS — I129 Hypertensive chronic kidney disease with stage 1 through stage 4 chronic kidney disease, or unspecified chronic kidney disease: Secondary | ICD-10-CM | POA: Diagnosis not present

## 2022-08-02 DIAGNOSIS — Z7985 Long-term (current) use of injectable non-insulin antidiabetic drugs: Secondary | ICD-10-CM | POA: Diagnosis not present

## 2022-08-05 DIAGNOSIS — M461 Sacroiliitis, not elsewhere classified: Secondary | ICD-10-CM | POA: Diagnosis not present

## 2022-08-05 DIAGNOSIS — I129 Hypertensive chronic kidney disease with stage 1 through stage 4 chronic kidney disease, or unspecified chronic kidney disease: Secondary | ICD-10-CM | POA: Diagnosis not present

## 2022-08-16 DIAGNOSIS — J069 Acute upper respiratory infection, unspecified: Secondary | ICD-10-CM | POA: Diagnosis not present

## 2022-08-16 DIAGNOSIS — Z20822 Contact with and (suspected) exposure to covid-19: Secondary | ICD-10-CM | POA: Diagnosis not present

## 2022-08-16 DIAGNOSIS — R059 Cough, unspecified: Secondary | ICD-10-CM | POA: Diagnosis not present

## 2022-09-05 DIAGNOSIS — M461 Sacroiliitis, not elsewhere classified: Secondary | ICD-10-CM | POA: Diagnosis not present

## 2022-09-05 DIAGNOSIS — I129 Hypertensive chronic kidney disease with stage 1 through stage 4 chronic kidney disease, or unspecified chronic kidney disease: Secondary | ICD-10-CM | POA: Diagnosis not present

## 2022-10-01 DIAGNOSIS — M545 Low back pain, unspecified: Secondary | ICD-10-CM | POA: Diagnosis not present

## 2022-10-01 DIAGNOSIS — R251 Tremor, unspecified: Secondary | ICD-10-CM | POA: Diagnosis not present

## 2022-10-01 DIAGNOSIS — Z86011 Personal history of benign neoplasm of the brain: Secondary | ICD-10-CM | POA: Diagnosis not present

## 2022-10-01 DIAGNOSIS — Z8669 Personal history of other diseases of the nervous system and sense organs: Secondary | ICD-10-CM | POA: Diagnosis not present

## 2022-10-01 DIAGNOSIS — R42 Dizziness and giddiness: Secondary | ICD-10-CM | POA: Diagnosis not present

## 2022-10-04 DIAGNOSIS — M461 Sacroiliitis, not elsewhere classified: Secondary | ICD-10-CM | POA: Diagnosis not present

## 2022-10-04 DIAGNOSIS — I129 Hypertensive chronic kidney disease with stage 1 through stage 4 chronic kidney disease, or unspecified chronic kidney disease: Secondary | ICD-10-CM | POA: Diagnosis not present

## 2022-10-14 DIAGNOSIS — Z7984 Long term (current) use of oral hypoglycemic drugs: Secondary | ICD-10-CM | POA: Diagnosis not present

## 2022-10-14 DIAGNOSIS — L602 Onychogryphosis: Secondary | ICD-10-CM | POA: Diagnosis not present

## 2022-10-14 DIAGNOSIS — E119 Type 2 diabetes mellitus without complications: Secondary | ICD-10-CM | POA: Diagnosis not present

## 2022-10-30 DIAGNOSIS — R569 Unspecified convulsions: Secondary | ICD-10-CM | POA: Diagnosis not present

## 2022-11-04 DIAGNOSIS — R251 Tremor, unspecified: Secondary | ICD-10-CM | POA: Diagnosis not present

## 2022-11-04 DIAGNOSIS — I129 Hypertensive chronic kidney disease with stage 1 through stage 4 chronic kidney disease, or unspecified chronic kidney disease: Secondary | ICD-10-CM | POA: Diagnosis not present

## 2022-11-04 DIAGNOSIS — M461 Sacroiliitis, not elsewhere classified: Secondary | ICD-10-CM | POA: Diagnosis not present

## 2022-11-13 DIAGNOSIS — L814 Other melanin hyperpigmentation: Secondary | ICD-10-CM | POA: Diagnosis not present

## 2022-11-13 DIAGNOSIS — L720 Epidermal cyst: Secondary | ICD-10-CM | POA: Diagnosis not present

## 2022-11-13 DIAGNOSIS — L728 Other follicular cysts of the skin and subcutaneous tissue: Secondary | ICD-10-CM | POA: Diagnosis not present

## 2022-11-13 DIAGNOSIS — L918 Other hypertrophic disorders of the skin: Secondary | ICD-10-CM | POA: Diagnosis not present

## 2022-12-04 DIAGNOSIS — M461 Sacroiliitis, not elsewhere classified: Secondary | ICD-10-CM | POA: Diagnosis not present

## 2022-12-04 DIAGNOSIS — I129 Hypertensive chronic kidney disease with stage 1 through stage 4 chronic kidney disease, or unspecified chronic kidney disease: Secondary | ICD-10-CM | POA: Diagnosis not present

## 2022-12-13 DIAGNOSIS — E1169 Type 2 diabetes mellitus with other specified complication: Secondary | ICD-10-CM | POA: Diagnosis not present

## 2022-12-17 DIAGNOSIS — E785 Hyperlipidemia, unspecified: Secondary | ICD-10-CM | POA: Diagnosis not present

## 2022-12-17 DIAGNOSIS — N182 Chronic kidney disease, stage 2 (mild): Secondary | ICD-10-CM | POA: Diagnosis not present

## 2022-12-17 DIAGNOSIS — E782 Mixed hyperlipidemia: Secondary | ICD-10-CM | POA: Diagnosis not present

## 2022-12-17 DIAGNOSIS — E1121 Type 2 diabetes mellitus with diabetic nephropathy: Secondary | ICD-10-CM | POA: Diagnosis not present

## 2022-12-17 DIAGNOSIS — I129 Hypertensive chronic kidney disease with stage 1 through stage 4 chronic kidney disease, or unspecified chronic kidney disease: Secondary | ICD-10-CM | POA: Diagnosis not present

## 2022-12-17 DIAGNOSIS — E1169 Type 2 diabetes mellitus with other specified complication: Secondary | ICD-10-CM | POA: Diagnosis not present

## 2022-12-27 DIAGNOSIS — R569 Unspecified convulsions: Secondary | ICD-10-CM | POA: Diagnosis not present

## 2022-12-27 DIAGNOSIS — G8929 Other chronic pain: Secondary | ICD-10-CM | POA: Diagnosis not present

## 2022-12-27 DIAGNOSIS — R251 Tremor, unspecified: Secondary | ICD-10-CM | POA: Diagnosis not present

## 2022-12-27 DIAGNOSIS — Z79899 Other long term (current) drug therapy: Secondary | ICD-10-CM | POA: Diagnosis not present

## 2022-12-27 DIAGNOSIS — M545 Low back pain, unspecified: Secondary | ICD-10-CM | POA: Diagnosis not present

## 2023-01-04 DIAGNOSIS — I129 Hypertensive chronic kidney disease with stage 1 through stage 4 chronic kidney disease, or unspecified chronic kidney disease: Secondary | ICD-10-CM | POA: Diagnosis not present

## 2023-01-04 DIAGNOSIS — E1121 Type 2 diabetes mellitus with diabetic nephropathy: Secondary | ICD-10-CM | POA: Diagnosis not present

## 2023-01-07 DIAGNOSIS — G8929 Other chronic pain: Secondary | ICD-10-CM | POA: Diagnosis not present

## 2023-01-07 DIAGNOSIS — M48061 Spinal stenosis, lumbar region without neurogenic claudication: Secondary | ICD-10-CM | POA: Diagnosis not present

## 2023-01-07 DIAGNOSIS — M545 Low back pain, unspecified: Secondary | ICD-10-CM | POA: Diagnosis not present

## 2023-01-07 DIAGNOSIS — D420 Neoplasm of uncertain behavior of cerebral meninges: Secondary | ICD-10-CM | POA: Diagnosis not present

## 2023-01-15 DIAGNOSIS — M791 Myalgia, unspecified site: Secondary | ICD-10-CM | POA: Diagnosis not present

## 2023-01-15 DIAGNOSIS — G890 Central pain syndrome: Secondary | ICD-10-CM | POA: Diagnosis not present

## 2023-01-27 DIAGNOSIS — E119 Type 2 diabetes mellitus without complications: Secondary | ICD-10-CM | POA: Diagnosis not present

## 2023-01-27 DIAGNOSIS — Z7984 Long term (current) use of oral hypoglycemic drugs: Secondary | ICD-10-CM | POA: Diagnosis not present

## 2023-01-27 DIAGNOSIS — L602 Onychogryphosis: Secondary | ICD-10-CM | POA: Diagnosis not present

## 2023-01-29 DIAGNOSIS — M791 Myalgia, unspecified site: Secondary | ICD-10-CM | POA: Diagnosis not present

## 2023-01-29 DIAGNOSIS — Z23 Encounter for immunization: Secondary | ICD-10-CM | POA: Diagnosis not present

## 2023-02-03 DIAGNOSIS — I129 Hypertensive chronic kidney disease with stage 1 through stage 4 chronic kidney disease, or unspecified chronic kidney disease: Secondary | ICD-10-CM | POA: Diagnosis not present

## 2023-02-03 DIAGNOSIS — E1121 Type 2 diabetes mellitus with diabetic nephropathy: Secondary | ICD-10-CM | POA: Diagnosis not present

## 2023-02-14 DIAGNOSIS — M47817 Spondylosis without myelopathy or radiculopathy, lumbosacral region: Secondary | ICD-10-CM | POA: Diagnosis not present

## 2023-02-14 DIAGNOSIS — G890 Central pain syndrome: Secondary | ICD-10-CM | POA: Diagnosis not present

## 2023-03-06 DIAGNOSIS — E1121 Type 2 diabetes mellitus with diabetic nephropathy: Secondary | ICD-10-CM | POA: Diagnosis not present

## 2023-03-06 DIAGNOSIS — I129 Hypertensive chronic kidney disease with stage 1 through stage 4 chronic kidney disease, or unspecified chronic kidney disease: Secondary | ICD-10-CM | POA: Diagnosis not present

## 2023-03-28 DIAGNOSIS — E1169 Type 2 diabetes mellitus with other specified complication: Secondary | ICD-10-CM | POA: Diagnosis not present

## 2023-04-03 DIAGNOSIS — I129 Hypertensive chronic kidney disease with stage 1 through stage 4 chronic kidney disease, or unspecified chronic kidney disease: Secondary | ICD-10-CM | POA: Diagnosis not present

## 2023-04-03 DIAGNOSIS — E119 Type 2 diabetes mellitus without complications: Secondary | ICD-10-CM | POA: Diagnosis not present

## 2023-04-03 DIAGNOSIS — Z23 Encounter for immunization: Secondary | ICD-10-CM | POA: Diagnosis not present

## 2023-04-03 DIAGNOSIS — L602 Onychogryphosis: Secondary | ICD-10-CM | POA: Diagnosis not present

## 2023-04-03 DIAGNOSIS — E1169 Type 2 diabetes mellitus with other specified complication: Secondary | ICD-10-CM | POA: Diagnosis not present

## 2023-04-03 DIAGNOSIS — Z139 Encounter for screening, unspecified: Secondary | ICD-10-CM | POA: Diagnosis not present

## 2023-04-03 DIAGNOSIS — Z1389 Encounter for screening for other disorder: Secondary | ICD-10-CM | POA: Diagnosis not present

## 2023-04-03 DIAGNOSIS — Z0000 Encounter for general adult medical examination without abnormal findings: Secondary | ICD-10-CM | POA: Diagnosis not present

## 2023-04-03 DIAGNOSIS — Z7984 Long term (current) use of oral hypoglycemic drugs: Secondary | ICD-10-CM | POA: Diagnosis not present

## 2023-04-03 DIAGNOSIS — E782 Mixed hyperlipidemia: Secondary | ICD-10-CM | POA: Diagnosis not present

## 2023-04-06 DIAGNOSIS — I129 Hypertensive chronic kidney disease with stage 1 through stage 4 chronic kidney disease, or unspecified chronic kidney disease: Secondary | ICD-10-CM | POA: Diagnosis not present

## 2023-04-06 DIAGNOSIS — E1121 Type 2 diabetes mellitus with diabetic nephropathy: Secondary | ICD-10-CM | POA: Diagnosis not present

## 2023-04-19 DIAGNOSIS — S32018A Other fracture of first lumbar vertebra, initial encounter for closed fracture: Secondary | ICD-10-CM | POA: Diagnosis not present

## 2023-04-19 DIAGNOSIS — Z888 Allergy status to other drugs, medicaments and biological substances status: Secondary | ICD-10-CM | POA: Diagnosis not present

## 2023-04-19 DIAGNOSIS — Z79899 Other long term (current) drug therapy: Secondary | ICD-10-CM | POA: Diagnosis not present

## 2023-04-19 DIAGNOSIS — R262 Difficulty in walking, not elsewhere classified: Secondary | ICD-10-CM | POA: Diagnosis not present

## 2023-04-19 DIAGNOSIS — M5136 Other intervertebral disc degeneration, lumbar region: Secondary | ICD-10-CM | POA: Diagnosis not present

## 2023-04-19 DIAGNOSIS — E7800 Pure hypercholesterolemia, unspecified: Secondary | ICD-10-CM | POA: Diagnosis not present

## 2023-04-19 DIAGNOSIS — Z7401 Bed confinement status: Secondary | ICD-10-CM | POA: Diagnosis not present

## 2023-04-19 DIAGNOSIS — M545 Low back pain, unspecified: Secondary | ICD-10-CM | POA: Diagnosis not present

## 2023-04-19 DIAGNOSIS — Z882 Allergy status to sulfonamides status: Secondary | ICD-10-CM | POA: Diagnosis not present

## 2023-04-19 DIAGNOSIS — R531 Weakness: Secondary | ICD-10-CM | POA: Diagnosis not present

## 2023-04-19 DIAGNOSIS — E871 Hypo-osmolality and hyponatremia: Secondary | ICD-10-CM | POA: Diagnosis not present

## 2023-04-19 DIAGNOSIS — E119 Type 2 diabetes mellitus without complications: Secondary | ICD-10-CM | POA: Diagnosis not present

## 2023-04-19 DIAGNOSIS — E785 Hyperlipidemia, unspecified: Secondary | ICD-10-CM | POA: Diagnosis not present

## 2023-04-19 DIAGNOSIS — G9389 Other specified disorders of brain: Secondary | ICD-10-CM | POA: Diagnosis not present

## 2023-04-19 DIAGNOSIS — I10 Essential (primary) hypertension: Secondary | ICD-10-CM | POA: Diagnosis not present

## 2023-04-19 DIAGNOSIS — R52 Pain, unspecified: Secondary | ICD-10-CM | POA: Diagnosis not present

## 2023-04-19 DIAGNOSIS — Z7985 Long-term (current) use of injectable non-insulin antidiabetic drugs: Secondary | ICD-10-CM | POA: Diagnosis not present

## 2023-04-19 DIAGNOSIS — M549 Dorsalgia, unspecified: Secondary | ICD-10-CM | POA: Diagnosis not present

## 2023-04-19 DIAGNOSIS — Z66 Do not resuscitate: Secondary | ICD-10-CM | POA: Diagnosis not present

## 2023-04-19 DIAGNOSIS — Z791 Long term (current) use of non-steroidal anti-inflammatories (NSAID): Secondary | ICD-10-CM | POA: Diagnosis not present

## 2023-04-19 DIAGNOSIS — Z86011 Personal history of benign neoplasm of the brain: Secondary | ICD-10-CM | POA: Diagnosis not present

## 2023-04-19 DIAGNOSIS — Z79891 Long term (current) use of opiate analgesic: Secondary | ICD-10-CM | POA: Diagnosis not present

## 2023-04-19 DIAGNOSIS — Z885 Allergy status to narcotic agent status: Secondary | ICD-10-CM | POA: Diagnosis not present

## 2023-04-19 DIAGNOSIS — W19XXXA Unspecified fall, initial encounter: Secondary | ICD-10-CM | POA: Diagnosis not present

## 2023-04-19 DIAGNOSIS — R6889 Other general symptoms and signs: Secondary | ICD-10-CM | POA: Diagnosis not present

## 2023-04-19 DIAGNOSIS — S32019D Unspecified fracture of first lumbar vertebra, subsequent encounter for fracture with routine healing: Secondary | ICD-10-CM | POA: Diagnosis not present

## 2023-04-19 DIAGNOSIS — Z743 Need for continuous supervision: Secondary | ICD-10-CM | POA: Diagnosis not present

## 2023-04-19 DIAGNOSIS — M6281 Muscle weakness (generalized): Secondary | ICD-10-CM | POA: Diagnosis not present

## 2023-04-19 DIAGNOSIS — E876 Hypokalemia: Secondary | ICD-10-CM | POA: Diagnosis not present

## 2023-04-19 DIAGNOSIS — S32009A Unspecified fracture of unspecified lumbar vertebra, initial encounter for closed fracture: Secondary | ICD-10-CM | POA: Diagnosis not present

## 2023-04-19 DIAGNOSIS — R55 Syncope and collapse: Secondary | ICD-10-CM | POA: Diagnosis not present

## 2023-04-25 DIAGNOSIS — R251 Tremor, unspecified: Secondary | ICD-10-CM | POA: Diagnosis not present

## 2023-04-25 DIAGNOSIS — S32018A Other fracture of first lumbar vertebra, initial encounter for closed fracture: Secondary | ICD-10-CM | POA: Diagnosis not present

## 2023-04-25 DIAGNOSIS — S32019D Unspecified fracture of first lumbar vertebra, subsequent encounter for fracture with routine healing: Secondary | ICD-10-CM | POA: Diagnosis not present

## 2023-04-25 DIAGNOSIS — R531 Weakness: Secondary | ICD-10-CM | POA: Diagnosis not present

## 2023-04-25 DIAGNOSIS — Z7401 Bed confinement status: Secondary | ICD-10-CM | POA: Diagnosis not present

## 2023-04-25 DIAGNOSIS — E785 Hyperlipidemia, unspecified: Secondary | ICD-10-CM | POA: Diagnosis not present

## 2023-04-25 DIAGNOSIS — E876 Hypokalemia: Secondary | ICD-10-CM | POA: Diagnosis not present

## 2023-04-25 DIAGNOSIS — I10 Essential (primary) hypertension: Secondary | ICD-10-CM | POA: Diagnosis not present

## 2023-04-25 DIAGNOSIS — M810 Age-related osteoporosis without current pathological fracture: Secondary | ICD-10-CM | POA: Diagnosis not present

## 2023-04-25 DIAGNOSIS — R262 Difficulty in walking, not elsewhere classified: Secondary | ICD-10-CM | POA: Diagnosis not present

## 2023-04-25 DIAGNOSIS — M6281 Muscle weakness (generalized): Secondary | ICD-10-CM | POA: Diagnosis not present

## 2023-04-25 DIAGNOSIS — R569 Unspecified convulsions: Secondary | ICD-10-CM | POA: Diagnosis not present

## 2023-04-25 DIAGNOSIS — M549 Dorsalgia, unspecified: Secondary | ICD-10-CM | POA: Diagnosis not present

## 2023-04-25 DIAGNOSIS — R252 Cramp and spasm: Secondary | ICD-10-CM | POA: Diagnosis not present

## 2023-04-25 DIAGNOSIS — E871 Hypo-osmolality and hyponatremia: Secondary | ICD-10-CM | POA: Diagnosis not present

## 2023-04-25 DIAGNOSIS — E119 Type 2 diabetes mellitus without complications: Secondary | ICD-10-CM | POA: Diagnosis not present

## 2023-04-25 DIAGNOSIS — Z743 Need for continuous supervision: Secondary | ICD-10-CM | POA: Diagnosis not present

## 2023-04-25 DIAGNOSIS — S32019S Unspecified fracture of first lumbar vertebra, sequela: Secondary | ICD-10-CM | POA: Diagnosis not present

## 2023-04-28 DIAGNOSIS — E119 Type 2 diabetes mellitus without complications: Secondary | ICD-10-CM | POA: Diagnosis not present

## 2023-04-28 DIAGNOSIS — E871 Hypo-osmolality and hyponatremia: Secondary | ICD-10-CM | POA: Diagnosis not present

## 2023-04-28 DIAGNOSIS — R569 Unspecified convulsions: Secondary | ICD-10-CM | POA: Diagnosis not present

## 2023-04-28 DIAGNOSIS — M810 Age-related osteoporosis without current pathological fracture: Secondary | ICD-10-CM | POA: Diagnosis not present

## 2023-04-28 DIAGNOSIS — S32019S Unspecified fracture of first lumbar vertebra, sequela: Secondary | ICD-10-CM | POA: Diagnosis not present

## 2023-04-28 DIAGNOSIS — E785 Hyperlipidemia, unspecified: Secondary | ICD-10-CM | POA: Diagnosis not present

## 2023-04-28 DIAGNOSIS — I10 Essential (primary) hypertension: Secondary | ICD-10-CM | POA: Diagnosis not present

## 2023-04-28 DIAGNOSIS — R252 Cramp and spasm: Secondary | ICD-10-CM | POA: Diagnosis not present

## 2023-04-28 DIAGNOSIS — R251 Tremor, unspecified: Secondary | ICD-10-CM | POA: Diagnosis not present

## 2023-04-28 DIAGNOSIS — M6281 Muscle weakness (generalized): Secondary | ICD-10-CM | POA: Diagnosis not present

## 2023-04-29 DIAGNOSIS — E119 Type 2 diabetes mellitus without complications: Secondary | ICD-10-CM | POA: Diagnosis not present

## 2023-04-29 DIAGNOSIS — E785 Hyperlipidemia, unspecified: Secondary | ICD-10-CM | POA: Diagnosis not present

## 2023-04-29 DIAGNOSIS — I10 Essential (primary) hypertension: Secondary | ICD-10-CM | POA: Diagnosis not present

## 2023-04-30 DIAGNOSIS — E119 Type 2 diabetes mellitus without complications: Secondary | ICD-10-CM | POA: Diagnosis not present

## 2023-04-30 DIAGNOSIS — I10 Essential (primary) hypertension: Secondary | ICD-10-CM | POA: Diagnosis not present

## 2023-04-30 DIAGNOSIS — E785 Hyperlipidemia, unspecified: Secondary | ICD-10-CM | POA: Diagnosis not present

## 2023-05-04 DIAGNOSIS — I10 Essential (primary) hypertension: Secondary | ICD-10-CM | POA: Diagnosis not present

## 2023-05-04 DIAGNOSIS — R251 Tremor, unspecified: Secondary | ICD-10-CM | POA: Diagnosis not present

## 2023-05-04 DIAGNOSIS — R569 Unspecified convulsions: Secondary | ICD-10-CM | POA: Diagnosis not present

## 2023-05-04 DIAGNOSIS — M6281 Muscle weakness (generalized): Secondary | ICD-10-CM | POA: Diagnosis not present

## 2023-05-06 DIAGNOSIS — E1121 Type 2 diabetes mellitus with diabetic nephropathy: Secondary | ICD-10-CM | POA: Diagnosis not present

## 2023-05-06 DIAGNOSIS — E785 Hyperlipidemia, unspecified: Secondary | ICD-10-CM | POA: Diagnosis not present

## 2023-05-06 DIAGNOSIS — R262 Difficulty in walking, not elsewhere classified: Secondary | ICD-10-CM | POA: Diagnosis not present

## 2023-05-06 DIAGNOSIS — S32019D Unspecified fracture of first lumbar vertebra, subsequent encounter for fracture with routine healing: Secondary | ICD-10-CM | POA: Diagnosis not present

## 2023-05-06 DIAGNOSIS — D420 Neoplasm of uncertain behavior of cerebral meninges: Secondary | ICD-10-CM | POA: Diagnosis not present

## 2023-05-06 DIAGNOSIS — I129 Hypertensive chronic kidney disease with stage 1 through stage 4 chronic kidney disease, or unspecified chronic kidney disease: Secondary | ICD-10-CM | POA: Diagnosis not present

## 2023-05-06 DIAGNOSIS — I10 Essential (primary) hypertension: Secondary | ICD-10-CM | POA: Diagnosis not present

## 2023-05-06 DIAGNOSIS — G890 Central pain syndrome: Secondary | ICD-10-CM | POA: Diagnosis not present

## 2023-05-06 DIAGNOSIS — E119 Type 2 diabetes mellitus without complications: Secondary | ICD-10-CM | POA: Diagnosis not present

## 2023-05-06 DIAGNOSIS — M6281 Muscle weakness (generalized): Secondary | ICD-10-CM | POA: Diagnosis not present

## 2023-05-06 DIAGNOSIS — S32010S Wedge compression fracture of first lumbar vertebra, sequela: Secondary | ICD-10-CM | POA: Diagnosis not present

## 2023-05-07 DIAGNOSIS — G890 Central pain syndrome: Secondary | ICD-10-CM | POA: Diagnosis not present

## 2023-05-07 DIAGNOSIS — D420 Neoplasm of uncertain behavior of cerebral meninges: Secondary | ICD-10-CM | POA: Diagnosis not present

## 2023-05-07 DIAGNOSIS — S32010S Wedge compression fracture of first lumbar vertebra, sequela: Secondary | ICD-10-CM | POA: Diagnosis not present

## 2023-05-16 DIAGNOSIS — E119 Type 2 diabetes mellitus without complications: Secondary | ICD-10-CM | POA: Diagnosis not present

## 2023-05-16 DIAGNOSIS — E785 Hyperlipidemia, unspecified: Secondary | ICD-10-CM | POA: Diagnosis not present

## 2023-05-16 DIAGNOSIS — I10 Essential (primary) hypertension: Secondary | ICD-10-CM | POA: Diagnosis not present

## 2023-05-19 DIAGNOSIS — Z9181 History of falling: Secondary | ICD-10-CM | POA: Diagnosis not present

## 2023-05-19 DIAGNOSIS — Z791 Long term (current) use of non-steroidal anti-inflammatories (NSAID): Secondary | ICD-10-CM | POA: Diagnosis not present

## 2023-05-19 DIAGNOSIS — Z7984 Long term (current) use of oral hypoglycemic drugs: Secondary | ICD-10-CM | POA: Diagnosis not present

## 2023-05-19 DIAGNOSIS — I10 Essential (primary) hypertension: Secondary | ICD-10-CM | POA: Diagnosis not present

## 2023-05-19 DIAGNOSIS — E785 Hyperlipidemia, unspecified: Secondary | ICD-10-CM | POA: Diagnosis not present

## 2023-05-19 DIAGNOSIS — Z794 Long term (current) use of insulin: Secondary | ICD-10-CM | POA: Diagnosis not present

## 2023-05-19 DIAGNOSIS — E119 Type 2 diabetes mellitus without complications: Secondary | ICD-10-CM | POA: Diagnosis not present

## 2023-05-19 DIAGNOSIS — S32018D Other fracture of first lumbar vertebra, subsequent encounter for fracture with routine healing: Secondary | ICD-10-CM | POA: Diagnosis not present

## 2023-05-22 DIAGNOSIS — R059 Cough, unspecified: Secondary | ICD-10-CM | POA: Diagnosis not present

## 2023-05-22 DIAGNOSIS — Z20822 Contact with and (suspected) exposure to covid-19: Secondary | ICD-10-CM | POA: Diagnosis not present

## 2023-05-22 DIAGNOSIS — U071 COVID-19: Secondary | ICD-10-CM | POA: Diagnosis not present

## 2023-05-22 DIAGNOSIS — B338 Other specified viral diseases: Secondary | ICD-10-CM | POA: Diagnosis not present

## 2023-05-29 DIAGNOSIS — E871 Hypo-osmolality and hyponatremia: Secondary | ICD-10-CM | POA: Diagnosis not present

## 2023-05-29 DIAGNOSIS — S32019A Unspecified fracture of first lumbar vertebra, initial encounter for closed fracture: Secondary | ICD-10-CM | POA: Diagnosis not present

## 2023-05-29 DIAGNOSIS — Z7689 Persons encountering health services in other specified circumstances: Secondary | ICD-10-CM | POA: Diagnosis not present

## 2023-05-30 DIAGNOSIS — E785 Hyperlipidemia, unspecified: Secondary | ICD-10-CM | POA: Diagnosis not present

## 2023-05-30 DIAGNOSIS — Z794 Long term (current) use of insulin: Secondary | ICD-10-CM | POA: Diagnosis not present

## 2023-05-30 DIAGNOSIS — S32018D Other fracture of first lumbar vertebra, subsequent encounter for fracture with routine healing: Secondary | ICD-10-CM | POA: Diagnosis not present

## 2023-05-30 DIAGNOSIS — E119 Type 2 diabetes mellitus without complications: Secondary | ICD-10-CM | POA: Diagnosis not present

## 2023-05-30 DIAGNOSIS — Z9181 History of falling: Secondary | ICD-10-CM | POA: Diagnosis not present

## 2023-05-30 DIAGNOSIS — I10 Essential (primary) hypertension: Secondary | ICD-10-CM | POA: Diagnosis not present

## 2023-05-30 DIAGNOSIS — Z791 Long term (current) use of non-steroidal anti-inflammatories (NSAID): Secondary | ICD-10-CM | POA: Diagnosis not present

## 2023-05-30 DIAGNOSIS — Z7984 Long term (current) use of oral hypoglycemic drugs: Secondary | ICD-10-CM | POA: Diagnosis not present

## 2023-06-03 DIAGNOSIS — Z794 Long term (current) use of insulin: Secondary | ICD-10-CM | POA: Diagnosis not present

## 2023-06-03 DIAGNOSIS — E785 Hyperlipidemia, unspecified: Secondary | ICD-10-CM | POA: Diagnosis not present

## 2023-06-03 DIAGNOSIS — I10 Essential (primary) hypertension: Secondary | ICD-10-CM | POA: Diagnosis not present

## 2023-06-03 DIAGNOSIS — S32018D Other fracture of first lumbar vertebra, subsequent encounter for fracture with routine healing: Secondary | ICD-10-CM | POA: Diagnosis not present

## 2023-06-03 DIAGNOSIS — E119 Type 2 diabetes mellitus without complications: Secondary | ICD-10-CM | POA: Diagnosis not present

## 2023-06-03 DIAGNOSIS — Z7984 Long term (current) use of oral hypoglycemic drugs: Secondary | ICD-10-CM | POA: Diagnosis not present

## 2023-06-03 DIAGNOSIS — Z791 Long term (current) use of non-steroidal anti-inflammatories (NSAID): Secondary | ICD-10-CM | POA: Diagnosis not present

## 2023-06-03 DIAGNOSIS — Z9181 History of falling: Secondary | ICD-10-CM | POA: Diagnosis not present

## 2023-06-05 DIAGNOSIS — Z9181 History of falling: Secondary | ICD-10-CM | POA: Diagnosis not present

## 2023-06-05 DIAGNOSIS — Z791 Long term (current) use of non-steroidal anti-inflammatories (NSAID): Secondary | ICD-10-CM | POA: Diagnosis not present

## 2023-06-05 DIAGNOSIS — Z794 Long term (current) use of insulin: Secondary | ICD-10-CM | POA: Diagnosis not present

## 2023-06-05 DIAGNOSIS — E785 Hyperlipidemia, unspecified: Secondary | ICD-10-CM | POA: Diagnosis not present

## 2023-06-05 DIAGNOSIS — S32018D Other fracture of first lumbar vertebra, subsequent encounter for fracture with routine healing: Secondary | ICD-10-CM | POA: Diagnosis not present

## 2023-06-05 DIAGNOSIS — I10 Essential (primary) hypertension: Secondary | ICD-10-CM | POA: Diagnosis not present

## 2023-06-05 DIAGNOSIS — E119 Type 2 diabetes mellitus without complications: Secondary | ICD-10-CM | POA: Diagnosis not present

## 2023-06-05 DIAGNOSIS — Z7984 Long term (current) use of oral hypoglycemic drugs: Secondary | ICD-10-CM | POA: Diagnosis not present

## 2023-06-06 DIAGNOSIS — I129 Hypertensive chronic kidney disease with stage 1 through stage 4 chronic kidney disease, or unspecified chronic kidney disease: Secondary | ICD-10-CM | POA: Diagnosis not present

## 2023-06-06 DIAGNOSIS — E1121 Type 2 diabetes mellitus with diabetic nephropathy: Secondary | ICD-10-CM | POA: Diagnosis not present

## 2023-06-10 DIAGNOSIS — Z23 Encounter for immunization: Secondary | ICD-10-CM | POA: Diagnosis not present

## 2023-06-11 DIAGNOSIS — E785 Hyperlipidemia, unspecified: Secondary | ICD-10-CM | POA: Diagnosis not present

## 2023-06-11 DIAGNOSIS — S32018D Other fracture of first lumbar vertebra, subsequent encounter for fracture with routine healing: Secondary | ICD-10-CM | POA: Diagnosis not present

## 2023-06-11 DIAGNOSIS — Z794 Long term (current) use of insulin: Secondary | ICD-10-CM | POA: Diagnosis not present

## 2023-06-11 DIAGNOSIS — Z791 Long term (current) use of non-steroidal anti-inflammatories (NSAID): Secondary | ICD-10-CM | POA: Diagnosis not present

## 2023-06-11 DIAGNOSIS — E119 Type 2 diabetes mellitus without complications: Secondary | ICD-10-CM | POA: Diagnosis not present

## 2023-06-11 DIAGNOSIS — Z9181 History of falling: Secondary | ICD-10-CM | POA: Diagnosis not present

## 2023-06-11 DIAGNOSIS — Z7984 Long term (current) use of oral hypoglycemic drugs: Secondary | ICD-10-CM | POA: Diagnosis not present

## 2023-06-11 DIAGNOSIS — I10 Essential (primary) hypertension: Secondary | ICD-10-CM | POA: Diagnosis not present

## 2023-06-13 DIAGNOSIS — S32018D Other fracture of first lumbar vertebra, subsequent encounter for fracture with routine healing: Secondary | ICD-10-CM | POA: Diagnosis not present

## 2023-06-13 DIAGNOSIS — E785 Hyperlipidemia, unspecified: Secondary | ICD-10-CM | POA: Diagnosis not present

## 2023-06-13 DIAGNOSIS — Z794 Long term (current) use of insulin: Secondary | ICD-10-CM | POA: Diagnosis not present

## 2023-06-13 DIAGNOSIS — E119 Type 2 diabetes mellitus without complications: Secondary | ICD-10-CM | POA: Diagnosis not present

## 2023-06-13 DIAGNOSIS — Z7984 Long term (current) use of oral hypoglycemic drugs: Secondary | ICD-10-CM | POA: Diagnosis not present

## 2023-06-13 DIAGNOSIS — Z9181 History of falling: Secondary | ICD-10-CM | POA: Diagnosis not present

## 2023-06-13 DIAGNOSIS — Z791 Long term (current) use of non-steroidal anti-inflammatories (NSAID): Secondary | ICD-10-CM | POA: Diagnosis not present

## 2023-06-13 DIAGNOSIS — I10 Essential (primary) hypertension: Secondary | ICD-10-CM | POA: Diagnosis not present

## 2023-06-17 DIAGNOSIS — E119 Type 2 diabetes mellitus without complications: Secondary | ICD-10-CM | POA: Diagnosis not present

## 2023-06-17 DIAGNOSIS — S32018D Other fracture of first lumbar vertebra, subsequent encounter for fracture with routine healing: Secondary | ICD-10-CM | POA: Diagnosis not present

## 2023-06-17 DIAGNOSIS — Z791 Long term (current) use of non-steroidal anti-inflammatories (NSAID): Secondary | ICD-10-CM | POA: Diagnosis not present

## 2023-06-17 DIAGNOSIS — Z9181 History of falling: Secondary | ICD-10-CM | POA: Diagnosis not present

## 2023-06-17 DIAGNOSIS — Z794 Long term (current) use of insulin: Secondary | ICD-10-CM | POA: Diagnosis not present

## 2023-06-17 DIAGNOSIS — E785 Hyperlipidemia, unspecified: Secondary | ICD-10-CM | POA: Diagnosis not present

## 2023-06-17 DIAGNOSIS — I10 Essential (primary) hypertension: Secondary | ICD-10-CM | POA: Diagnosis not present

## 2023-06-17 DIAGNOSIS — Z7984 Long term (current) use of oral hypoglycemic drugs: Secondary | ICD-10-CM | POA: Diagnosis not present

## 2023-06-26 DIAGNOSIS — D420 Neoplasm of uncertain behavior of cerebral meninges: Secondary | ICD-10-CM | POA: Diagnosis not present

## 2023-06-26 DIAGNOSIS — D320 Benign neoplasm of cerebral meninges: Secondary | ICD-10-CM | POA: Diagnosis not present

## 2023-06-27 DIAGNOSIS — Z9181 History of falling: Secondary | ICD-10-CM | POA: Diagnosis not present

## 2023-06-27 DIAGNOSIS — Z794 Long term (current) use of insulin: Secondary | ICD-10-CM | POA: Diagnosis not present

## 2023-06-27 DIAGNOSIS — Z7984 Long term (current) use of oral hypoglycemic drugs: Secondary | ICD-10-CM | POA: Diagnosis not present

## 2023-06-27 DIAGNOSIS — E119 Type 2 diabetes mellitus without complications: Secondary | ICD-10-CM | POA: Diagnosis not present

## 2023-06-27 DIAGNOSIS — Z791 Long term (current) use of non-steroidal anti-inflammatories (NSAID): Secondary | ICD-10-CM | POA: Diagnosis not present

## 2023-06-27 DIAGNOSIS — I10 Essential (primary) hypertension: Secondary | ICD-10-CM | POA: Diagnosis not present

## 2023-06-27 DIAGNOSIS — S32018D Other fracture of first lumbar vertebra, subsequent encounter for fracture with routine healing: Secondary | ICD-10-CM | POA: Diagnosis not present

## 2023-06-27 DIAGNOSIS — E785 Hyperlipidemia, unspecified: Secondary | ICD-10-CM | POA: Diagnosis not present

## 2023-07-01 DIAGNOSIS — Z9181 History of falling: Secondary | ICD-10-CM | POA: Diagnosis not present

## 2023-07-01 DIAGNOSIS — Z7984 Long term (current) use of oral hypoglycemic drugs: Secondary | ICD-10-CM | POA: Diagnosis not present

## 2023-07-01 DIAGNOSIS — S32018D Other fracture of first lumbar vertebra, subsequent encounter for fracture with routine healing: Secondary | ICD-10-CM | POA: Diagnosis not present

## 2023-07-01 DIAGNOSIS — E785 Hyperlipidemia, unspecified: Secondary | ICD-10-CM | POA: Diagnosis not present

## 2023-07-01 DIAGNOSIS — Z791 Long term (current) use of non-steroidal anti-inflammatories (NSAID): Secondary | ICD-10-CM | POA: Diagnosis not present

## 2023-07-01 DIAGNOSIS — E119 Type 2 diabetes mellitus without complications: Secondary | ICD-10-CM | POA: Diagnosis not present

## 2023-07-01 DIAGNOSIS — Z794 Long term (current) use of insulin: Secondary | ICD-10-CM | POA: Diagnosis not present

## 2023-07-01 DIAGNOSIS — I10 Essential (primary) hypertension: Secondary | ICD-10-CM | POA: Diagnosis not present

## 2023-07-06 DIAGNOSIS — I129 Hypertensive chronic kidney disease with stage 1 through stage 4 chronic kidney disease, or unspecified chronic kidney disease: Secondary | ICD-10-CM | POA: Diagnosis not present

## 2023-07-06 DIAGNOSIS — E1121 Type 2 diabetes mellitus with diabetic nephropathy: Secondary | ICD-10-CM | POA: Diagnosis not present

## 2023-07-07 DIAGNOSIS — Z7984 Long term (current) use of oral hypoglycemic drugs: Secondary | ICD-10-CM | POA: Diagnosis not present

## 2023-07-07 DIAGNOSIS — E119 Type 2 diabetes mellitus without complications: Secondary | ICD-10-CM | POA: Diagnosis not present

## 2023-07-07 DIAGNOSIS — L602 Onychogryphosis: Secondary | ICD-10-CM | POA: Diagnosis not present

## 2023-07-09 DIAGNOSIS — Z791 Long term (current) use of non-steroidal anti-inflammatories (NSAID): Secondary | ICD-10-CM | POA: Diagnosis not present

## 2023-07-09 DIAGNOSIS — I10 Essential (primary) hypertension: Secondary | ICD-10-CM | POA: Diagnosis not present

## 2023-07-09 DIAGNOSIS — E785 Hyperlipidemia, unspecified: Secondary | ICD-10-CM | POA: Diagnosis not present

## 2023-07-09 DIAGNOSIS — Z9181 History of falling: Secondary | ICD-10-CM | POA: Diagnosis not present

## 2023-07-09 DIAGNOSIS — Z7984 Long term (current) use of oral hypoglycemic drugs: Secondary | ICD-10-CM | POA: Diagnosis not present

## 2023-07-09 DIAGNOSIS — S32018D Other fracture of first lumbar vertebra, subsequent encounter for fracture with routine healing: Secondary | ICD-10-CM | POA: Diagnosis not present

## 2023-07-09 DIAGNOSIS — E119 Type 2 diabetes mellitus without complications: Secondary | ICD-10-CM | POA: Diagnosis not present

## 2023-07-09 DIAGNOSIS — Z794 Long term (current) use of insulin: Secondary | ICD-10-CM | POA: Diagnosis not present

## 2023-07-11 DIAGNOSIS — Z794 Long term (current) use of insulin: Secondary | ICD-10-CM | POA: Diagnosis not present

## 2023-07-11 DIAGNOSIS — S32018D Other fracture of first lumbar vertebra, subsequent encounter for fracture with routine healing: Secondary | ICD-10-CM | POA: Diagnosis not present

## 2023-07-11 DIAGNOSIS — E119 Type 2 diabetes mellitus without complications: Secondary | ICD-10-CM | POA: Diagnosis not present

## 2023-07-11 DIAGNOSIS — Z7984 Long term (current) use of oral hypoglycemic drugs: Secondary | ICD-10-CM | POA: Diagnosis not present

## 2023-07-11 DIAGNOSIS — Z9181 History of falling: Secondary | ICD-10-CM | POA: Diagnosis not present

## 2023-07-11 DIAGNOSIS — I10 Essential (primary) hypertension: Secondary | ICD-10-CM | POA: Diagnosis not present

## 2023-07-11 DIAGNOSIS — Z791 Long term (current) use of non-steroidal anti-inflammatories (NSAID): Secondary | ICD-10-CM | POA: Diagnosis not present

## 2023-07-11 DIAGNOSIS — E785 Hyperlipidemia, unspecified: Secondary | ICD-10-CM | POA: Diagnosis not present

## 2023-07-17 DIAGNOSIS — E119 Type 2 diabetes mellitus without complications: Secondary | ICD-10-CM | POA: Diagnosis not present

## 2023-07-17 DIAGNOSIS — Z794 Long term (current) use of insulin: Secondary | ICD-10-CM | POA: Diagnosis not present

## 2023-07-17 DIAGNOSIS — E785 Hyperlipidemia, unspecified: Secondary | ICD-10-CM | POA: Diagnosis not present

## 2023-07-17 DIAGNOSIS — S32018D Other fracture of first lumbar vertebra, subsequent encounter for fracture with routine healing: Secondary | ICD-10-CM | POA: Diagnosis not present

## 2023-07-17 DIAGNOSIS — Z7984 Long term (current) use of oral hypoglycemic drugs: Secondary | ICD-10-CM | POA: Diagnosis not present

## 2023-07-17 DIAGNOSIS — Z791 Long term (current) use of non-steroidal anti-inflammatories (NSAID): Secondary | ICD-10-CM | POA: Diagnosis not present

## 2023-07-17 DIAGNOSIS — I10 Essential (primary) hypertension: Secondary | ICD-10-CM | POA: Diagnosis not present

## 2023-07-17 DIAGNOSIS — Z9181 History of falling: Secondary | ICD-10-CM | POA: Diagnosis not present

## 2023-07-22 DIAGNOSIS — E1169 Type 2 diabetes mellitus with other specified complication: Secondary | ICD-10-CM | POA: Diagnosis not present

## 2023-07-22 DIAGNOSIS — Z23 Encounter for immunization: Secondary | ICD-10-CM | POA: Diagnosis not present

## 2023-07-23 DIAGNOSIS — E785 Hyperlipidemia, unspecified: Secondary | ICD-10-CM | POA: Diagnosis not present

## 2023-07-23 DIAGNOSIS — E119 Type 2 diabetes mellitus without complications: Secondary | ICD-10-CM | POA: Diagnosis not present

## 2023-07-23 DIAGNOSIS — Z9181 History of falling: Secondary | ICD-10-CM | POA: Diagnosis not present

## 2023-07-23 DIAGNOSIS — Z7984 Long term (current) use of oral hypoglycemic drugs: Secondary | ICD-10-CM | POA: Diagnosis not present

## 2023-07-23 DIAGNOSIS — S32018D Other fracture of first lumbar vertebra, subsequent encounter for fracture with routine healing: Secondary | ICD-10-CM | POA: Diagnosis not present

## 2023-07-23 DIAGNOSIS — Z794 Long term (current) use of insulin: Secondary | ICD-10-CM | POA: Diagnosis not present

## 2023-07-23 DIAGNOSIS — I10 Essential (primary) hypertension: Secondary | ICD-10-CM | POA: Diagnosis not present

## 2023-07-23 DIAGNOSIS — Z791 Long term (current) use of non-steroidal anti-inflammatories (NSAID): Secondary | ICD-10-CM | POA: Diagnosis not present

## 2023-07-24 DIAGNOSIS — I129 Hypertensive chronic kidney disease with stage 1 through stage 4 chronic kidney disease, or unspecified chronic kidney disease: Secondary | ICD-10-CM | POA: Diagnosis not present

## 2023-07-24 DIAGNOSIS — E1169 Type 2 diabetes mellitus with other specified complication: Secondary | ICD-10-CM | POA: Diagnosis not present

## 2023-07-24 DIAGNOSIS — E782 Mixed hyperlipidemia: Secondary | ICD-10-CM | POA: Diagnosis not present

## 2023-07-24 DIAGNOSIS — N182 Chronic kidney disease, stage 2 (mild): Secondary | ICD-10-CM | POA: Diagnosis not present

## 2023-07-25 DIAGNOSIS — Z9181 History of falling: Secondary | ICD-10-CM | POA: Diagnosis not present

## 2023-07-25 DIAGNOSIS — E785 Hyperlipidemia, unspecified: Secondary | ICD-10-CM | POA: Diagnosis not present

## 2023-07-25 DIAGNOSIS — S32018D Other fracture of first lumbar vertebra, subsequent encounter for fracture with routine healing: Secondary | ICD-10-CM | POA: Diagnosis not present

## 2023-07-25 DIAGNOSIS — E119 Type 2 diabetes mellitus without complications: Secondary | ICD-10-CM | POA: Diagnosis not present

## 2023-07-25 DIAGNOSIS — Z794 Long term (current) use of insulin: Secondary | ICD-10-CM | POA: Diagnosis not present

## 2023-07-25 DIAGNOSIS — I10 Essential (primary) hypertension: Secondary | ICD-10-CM | POA: Diagnosis not present

## 2023-07-25 DIAGNOSIS — Z791 Long term (current) use of non-steroidal anti-inflammatories (NSAID): Secondary | ICD-10-CM | POA: Diagnosis not present

## 2023-07-25 DIAGNOSIS — Z7984 Long term (current) use of oral hypoglycemic drugs: Secondary | ICD-10-CM | POA: Diagnosis not present

## 2023-07-28 DIAGNOSIS — Z791 Long term (current) use of non-steroidal anti-inflammatories (NSAID): Secondary | ICD-10-CM | POA: Diagnosis not present

## 2023-07-28 DIAGNOSIS — I10 Essential (primary) hypertension: Secondary | ICD-10-CM | POA: Diagnosis not present

## 2023-07-28 DIAGNOSIS — S32018D Other fracture of first lumbar vertebra, subsequent encounter for fracture with routine healing: Secondary | ICD-10-CM | POA: Diagnosis not present

## 2023-07-28 DIAGNOSIS — Z794 Long term (current) use of insulin: Secondary | ICD-10-CM | POA: Diagnosis not present

## 2023-07-28 DIAGNOSIS — E119 Type 2 diabetes mellitus without complications: Secondary | ICD-10-CM | POA: Diagnosis not present

## 2023-07-28 DIAGNOSIS — Z9181 History of falling: Secondary | ICD-10-CM | POA: Diagnosis not present

## 2023-07-28 DIAGNOSIS — Z7984 Long term (current) use of oral hypoglycemic drugs: Secondary | ICD-10-CM | POA: Diagnosis not present

## 2023-07-28 DIAGNOSIS — E785 Hyperlipidemia, unspecified: Secondary | ICD-10-CM | POA: Diagnosis not present

## 2023-08-06 DIAGNOSIS — I129 Hypertensive chronic kidney disease with stage 1 through stage 4 chronic kidney disease, or unspecified chronic kidney disease: Secondary | ICD-10-CM | POA: Diagnosis not present

## 2023-08-06 DIAGNOSIS — E1121 Type 2 diabetes mellitus with diabetic nephropathy: Secondary | ICD-10-CM | POA: Diagnosis not present

## 2023-08-21 DIAGNOSIS — R42 Dizziness and giddiness: Secondary | ICD-10-CM | POA: Diagnosis not present

## 2023-08-21 DIAGNOSIS — H6990 Unspecified Eustachian tube disorder, unspecified ear: Secondary | ICD-10-CM | POA: Diagnosis not present

## 2023-08-21 DIAGNOSIS — H6122 Impacted cerumen, left ear: Secondary | ICD-10-CM | POA: Diagnosis not present

## 2023-09-02 DIAGNOSIS — H6122 Impacted cerumen, left ear: Secondary | ICD-10-CM | POA: Diagnosis not present

## 2023-09-06 DIAGNOSIS — E1121 Type 2 diabetes mellitus with diabetic nephropathy: Secondary | ICD-10-CM | POA: Diagnosis not present

## 2023-09-06 DIAGNOSIS — I129 Hypertensive chronic kidney disease with stage 1 through stage 4 chronic kidney disease, or unspecified chronic kidney disease: Secondary | ICD-10-CM | POA: Diagnosis not present

## 2023-09-08 DIAGNOSIS — H6990 Unspecified Eustachian tube disorder, unspecified ear: Secondary | ICD-10-CM | POA: Diagnosis not present

## 2023-09-08 DIAGNOSIS — H8113 Benign paroxysmal vertigo, bilateral: Secondary | ICD-10-CM | POA: Diagnosis not present

## 2023-09-08 DIAGNOSIS — D420 Neoplasm of uncertain behavior of cerebral meninges: Secondary | ICD-10-CM | POA: Diagnosis not present

## 2023-09-08 DIAGNOSIS — D421 Neoplasm of uncertain behavior of spinal meninges: Secondary | ICD-10-CM | POA: Diagnosis not present

## 2023-09-11 DIAGNOSIS — H8113 Benign paroxysmal vertigo, bilateral: Secondary | ICD-10-CM | POA: Diagnosis not present

## 2023-09-11 DIAGNOSIS — R262 Difficulty in walking, not elsewhere classified: Secondary | ICD-10-CM | POA: Diagnosis not present

## 2023-09-16 ENCOUNTER — Encounter: Payer: Self-pay | Admitting: Neurology

## 2023-09-17 DIAGNOSIS — H8113 Benign paroxysmal vertigo, bilateral: Secondary | ICD-10-CM | POA: Diagnosis not present

## 2023-09-17 DIAGNOSIS — R262 Difficulty in walking, not elsewhere classified: Secondary | ICD-10-CM | POA: Diagnosis not present

## 2023-09-19 DIAGNOSIS — R262 Difficulty in walking, not elsewhere classified: Secondary | ICD-10-CM | POA: Diagnosis not present

## 2023-09-19 DIAGNOSIS — H8113 Benign paroxysmal vertigo, bilateral: Secondary | ICD-10-CM | POA: Diagnosis not present

## 2023-09-22 DIAGNOSIS — H8113 Benign paroxysmal vertigo, bilateral: Secondary | ICD-10-CM | POA: Diagnosis not present

## 2023-09-22 DIAGNOSIS — R262 Difficulty in walking, not elsewhere classified: Secondary | ICD-10-CM | POA: Diagnosis not present

## 2023-09-24 DIAGNOSIS — R262 Difficulty in walking, not elsewhere classified: Secondary | ICD-10-CM | POA: Diagnosis not present

## 2023-09-24 DIAGNOSIS — H8113 Benign paroxysmal vertigo, bilateral: Secondary | ICD-10-CM | POA: Diagnosis not present

## 2023-09-30 DIAGNOSIS — H8113 Benign paroxysmal vertigo, bilateral: Secondary | ICD-10-CM | POA: Diagnosis not present

## 2023-09-30 DIAGNOSIS — R262 Difficulty in walking, not elsewhere classified: Secondary | ICD-10-CM | POA: Diagnosis not present

## 2023-10-03 DIAGNOSIS — H8113 Benign paroxysmal vertigo, bilateral: Secondary | ICD-10-CM | POA: Diagnosis not present

## 2023-10-03 DIAGNOSIS — R262 Difficulty in walking, not elsewhere classified: Secondary | ICD-10-CM | POA: Diagnosis not present

## 2023-10-04 DIAGNOSIS — I129 Hypertensive chronic kidney disease with stage 1 through stage 4 chronic kidney disease, or unspecified chronic kidney disease: Secondary | ICD-10-CM | POA: Diagnosis not present

## 2023-10-04 DIAGNOSIS — E1121 Type 2 diabetes mellitus with diabetic nephropathy: Secondary | ICD-10-CM | POA: Diagnosis not present

## 2023-10-07 DIAGNOSIS — H8113 Benign paroxysmal vertigo, bilateral: Secondary | ICD-10-CM | POA: Diagnosis not present

## 2023-10-07 DIAGNOSIS — R262 Difficulty in walking, not elsewhere classified: Secondary | ICD-10-CM | POA: Diagnosis not present

## 2023-10-10 DIAGNOSIS — H8113 Benign paroxysmal vertigo, bilateral: Secondary | ICD-10-CM | POA: Diagnosis not present

## 2023-10-10 DIAGNOSIS — R262 Difficulty in walking, not elsewhere classified: Secondary | ICD-10-CM | POA: Diagnosis not present

## 2023-10-13 DIAGNOSIS — E119 Type 2 diabetes mellitus without complications: Secondary | ICD-10-CM | POA: Diagnosis not present

## 2023-10-13 DIAGNOSIS — L602 Onychogryphosis: Secondary | ICD-10-CM | POA: Diagnosis not present

## 2023-10-13 DIAGNOSIS — Z7984 Long term (current) use of oral hypoglycemic drugs: Secondary | ICD-10-CM | POA: Diagnosis not present

## 2023-10-15 DIAGNOSIS — H8113 Benign paroxysmal vertigo, bilateral: Secondary | ICD-10-CM | POA: Diagnosis not present

## 2023-10-15 DIAGNOSIS — R262 Difficulty in walking, not elsewhere classified: Secondary | ICD-10-CM | POA: Diagnosis not present

## 2023-10-17 DIAGNOSIS — R262 Difficulty in walking, not elsewhere classified: Secondary | ICD-10-CM | POA: Diagnosis not present

## 2023-10-17 DIAGNOSIS — H8113 Benign paroxysmal vertigo, bilateral: Secondary | ICD-10-CM | POA: Diagnosis not present

## 2023-10-22 DIAGNOSIS — H8113 Benign paroxysmal vertigo, bilateral: Secondary | ICD-10-CM | POA: Diagnosis not present

## 2023-10-22 DIAGNOSIS — R262 Difficulty in walking, not elsewhere classified: Secondary | ICD-10-CM | POA: Diagnosis not present

## 2023-10-24 DIAGNOSIS — R262 Difficulty in walking, not elsewhere classified: Secondary | ICD-10-CM | POA: Diagnosis not present

## 2023-10-24 DIAGNOSIS — H8113 Benign paroxysmal vertigo, bilateral: Secondary | ICD-10-CM | POA: Diagnosis not present

## 2023-10-29 DIAGNOSIS — R262 Difficulty in walking, not elsewhere classified: Secondary | ICD-10-CM | POA: Diagnosis not present

## 2023-10-29 DIAGNOSIS — H8113 Benign paroxysmal vertigo, bilateral: Secondary | ICD-10-CM | POA: Diagnosis not present

## 2023-10-31 DIAGNOSIS — R262 Difficulty in walking, not elsewhere classified: Secondary | ICD-10-CM | POA: Diagnosis not present

## 2023-10-31 DIAGNOSIS — H8113 Benign paroxysmal vertigo, bilateral: Secondary | ICD-10-CM | POA: Diagnosis not present

## 2023-11-03 ENCOUNTER — Ambulatory Visit (INDEPENDENT_AMBULATORY_CARE_PROVIDER_SITE_OTHER): Payer: 59 | Admitting: Neurology

## 2023-11-03 ENCOUNTER — Encounter: Payer: Self-pay | Admitting: Neurology

## 2023-11-03 VITALS — BP 137/72 | HR 88 | Ht 60.0 in | Wt 148.6 lb

## 2023-11-03 DIAGNOSIS — R42 Dizziness and giddiness: Secondary | ICD-10-CM

## 2023-11-03 DIAGNOSIS — R251 Tremor, unspecified: Secondary | ICD-10-CM

## 2023-11-03 DIAGNOSIS — G40209 Localization-related (focal) (partial) symptomatic epilepsy and epileptic syndromes with complex partial seizures, not intractable, without status epilepticus: Secondary | ICD-10-CM

## 2023-11-03 NOTE — Progress Notes (Signed)
 NEUROLOGY CONSULTATION NOTE  Kaitlin Bell MRN: [id number] DOB: 11-Mar-1946  Referring provider: Dr. Abner Greenspan Primary care provider: Dr. Abner Greenspan  Reason for consult:  establish care  Dear Dr Yetta Flock:  Thank you for your kind referral of Kaitlin Bell for consultation of the above symptoms. Although her history is well known to you, please allow me to reiterate it for the purpose of our medical record. The patient was accompanied to the clinic by her daughter-in-law Kaitlin Bell who also provides collateral information. Records and images were personally reviewed where available.   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 Records were reviewed, she has seen neurologists Dr. Alton Revere and Dr. Maple Hudson at [facility], last visit 05/2023. Per Dr. Lewis Moccasin note from 11/2022, she has had partial seizures with tingling/twitching that started in 2003, none since taking Trileptal. The patient reports that she started having tingling and pulsing in her muscles from head down or bottom up, occurring in different places. She agrees that she has not had any of these episodes since starting Trileptal. EEG in 10/2022 mildly abnormal with slow reactive posterior dominant rhythm at 7 Hz.   She is on Primidone for tremor L>R, dose increased to 50mg  TID in 11/2022 but it makes her depressed, so she is taking 50mg  BID with good response and no side effects. The tremor in her right hand is not as bad, the left hand tremor comes and goes, noticeable when writing, brushing her teeth, putting on makeup. She has a history of intermittent brief vertigo, she had a fall in 04/2023 but had vertigo before that. In January 2025, she started having them on a daily basis,  usually when she turns in bed or gets up in the morning, or when she lays on her pillow at night. She reports a spinning sensation, no nausea/vomiting, vision changes. PCP notes indicate meclizine and Fluticasone did not help. She as given Prednisone and was referred for Vestibular therapy. She has been doing Vestibular therapy for a month and notes that it does help, she is still having the vertigo but not as long, although more often, last was a week ago. She denies any headaches, diplopia, dysarthria/dysphagia, bowel/bladder dysfunction. She has chronic back pain. She fell a couple of weeks ago, she tripped at home (no associated vertigo), no loss of consciousness. She has a chronic left foot drop since surgery in 2005. She lives alone. She does not drive. Mood is good.   MRI with and without 06/2023 at [facility] (images unavailable for review) reported unchanged T2/FLAIR signal abnormality involving the right parietal and frontal lobes consistent with gliosis/treatment changes of prior meningioma resections. Similar enhancing dural based masses along the anterior falx (series 14, image 62 and 72). No mass effect. Generalized cerebral and cerebellar volume loss is present with ex vacuo expansion of the ventricles. Scattered foci of T2/FLAIR hyperintensity within the periventricular and subcortical white matter are typical for chronic microvascular ischemic disease in this age group. Similar surgical changes along the posterior aspect of the superior sagittal sinus (series 11, image 74).    PAST MEDICAL HISTORY: Past Medical History:  Diagnosis Date   Anxiety    Bipolar 1 disorder (HCC)    Diabetes (HCC)    High cholesterol    Hypertension  Seizures (HCC)    Tremor    left hand    PAST SURGICAL HISTORY: History reviewed. No pertinent surgical history.  MEDICATIONS: Current Outpatient Medications on File Prior to Visit  Medication Sig Dispense Refill   amLODipine (NORVASC) 10 MG tablet  Take 10 mg by mouth at bedtime.     diclofenac (VOLTAREN) 75 MG EC tablet Take 75 mg by mouth 2 (two) times daily as needed.     FARXIGA 10 MG TABS tablet Take 10 mg by mouth daily.     hydrOXYzine (ATARAX) 25 MG tablet Take 25 mg by mouth every 8 (eight) hours as needed.     losartan (COZAAR) 100 MG tablet Take 100 mg by mouth daily.     metoprolol succinate (TOPROL-XL) 50 MG 24 hr tablet Take 50 mg by mouth daily.     OLANZapine (ZYPREXA) 2.5 MG tablet Take 2.5 mg by mouth at bedtime.     Oxcarbazepine (TRILEPTAL) 300 MG tablet Take 1 tablet by mouth 2 (two) times daily.     OZEMPIC, 1 MG/DOSE, 4 MG/3ML SOPN Inject 1 mg into the skin once a week.     primidone (MYSOLINE) 50 MG tablet Take 50 mg by mouth 3 (three) times daily.     rosuvastatin (CRESTOR) 10 MG tablet Take 10 mg by mouth daily.     Vilazodone HCl (VIIBRYD) 40 MG TABS Take 40 mg by mouth daily.     No current facility-administered medications on file prior to visit.    ALLERGIES: Allergies  Allergen Reactions   Hydrocodone-Acetaminophen Itching and Swelling   Morphine Itching   Oxycodone-Acetaminophen Itching and Swelling   Propoxyphene    Tramadol Itching and Swelling   Nabumetone Itching, Rash and Swelling   Sulfa Antibiotics Swelling, Dermatitis, Rash and Itching    FAMILY HISTORY: History reviewed. No pertinent family history.  SOCIAL HISTORY: Social History   Socioeconomic History   Marital status: Divorced    Spouse name: Not on file   Number of children: Not on file   Years of education: Not on file   Highest education level: Not on file  Occupational History   Not on file  Tobacco Use   Smoking status: Never   Smokeless tobacco: Never  Vaping Use   Vaping status: Never Used  Substance and Sexual Activity   Alcohol use: Never   Drug use: Never   Sexual activity: Not on file  Other Topics Concern   Not on file  Social History Narrative   Are you right handed or left handed? Left    Are you  currently employed ? No    What is your current occupation?   Do you live at home alone? Alone    Who lives with you? NA   What type of home do you live in: 1 story or 2 story? 1 story        Unable to get blood products    Social Drivers of Corporate investment banker Strain: Not on file  Food Insecurity: Not on file  Transportation Needs: Not on file  Physical Activity: Not on file  Stress: Not on file  Social Connections: Not on file  Intimate Partner Violence: Not on file     PHYSICAL EXAM: Vitals:   11/03/23 1031  BP: 137/72  Pulse: 88  SpO2: 98%   General: No acute distress Head:  Normocephalic/atraumatic Skin/Extremities: No rash, no edema Neurological Exam: Mental status: alert and awake, no dysarthria or aphasia, [employer]  of knowledge is appropriate.  Attention and concentration are normal.   Cranial nerves: CN I: not tested CN II: pupils equal, round, visual fields intact CN III, IV, VI:  full range of motion, no nystagmus, no ptosis CN V: facial sensation intact CN VII: upper and lower face symmetric CN VIII: hearing intact to conversation Bulk & Tone: normal, no cogwheeling, no fasciculations. Motor: 5/5 on both UE, right LE, 5/5 left hip flexion, knee flexion/extension, 0/5 left foot dorsiflexion, eversion, 2/5 inversion, 3/5 plantarflexion Sensation: intact to light touch, cold, pin, vibration sense.  No extinction to double simultaneous stimulation.  Deep Tendon Reflexes: +1 on both UE and right LE, brisk +2 patella, +1 left ankle jerk, no ankle clonus Plantar responses: downgoing bilaterally Cerebellar: no incoordination on finger to nose, heel to shin. 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 We discussed the diagnosis, pathophysiology, and treatment of vertigo, she was reassured this is a vestibular issue and not due to brain pathology. Continue Vestibular therapy. She denies any seizures since 2003 on Oxcarbazepine 300mg  BID, tremors stable on Primidone 50mg  BID. Continue current course. She does not drive. Follow-up in 6 months, call for any changes.    Thank you for allowing me to participate in the care of this patient. Please do not hesitate to call for any questions or concerns.   Patrcia Dolly, M.D.  CC: Dr. Yetta Flock

## 2023-11-03 NOTE — Patient Instructions (Signed)
 Good to meet you!  Continue all your medications  2. Continue vestibular therapy  3. Follow-up in 6 months, call for any changes   Seizure Precautions: 1. If medication has been prescribed for you to prevent seizures, take it exactly as directed.  Do not stop taking the medicine without talking to your doctor first, even if you have not had a seizure in a long time.   2. Avoid activities in which a seizure would cause danger to yourself or to others.  Don't operate dangerous machinery, swim alone, or climb in high or dangerous places, such as on ladders, roofs, or girders.  Do not drive unless your doctor says you may.  3. If you have any warning that you may have a seizure, lay down in a safe place where you can't hurt yourself.    4.  No driving for 6 months from last seizure, as per [facility].   Please refer to the following link on the [employer]'s website for more information: http://www.epilepsyfoundation.org/answerplace/Social/driving/drivingu.cfm   5.  Maintain good sleep hygiene. Avoid alcohol  6.  Contact your doctor if you have any problems that may be related to the medicine you are taking.  7.  Call 911 and bring the patient back to the ED if:        A.  The seizure lasts longer than 5 minutes.       B.  The patient doesn't awaken shortly after the seizure  C.  The patient has new problems such as difficulty seeing, speaking or moving  D.  The patient was injured during the seizure  E.  The patient has a temperature over 102 F (39C)  F.  The patient vomited and now is having trouble breathing

## 2023-11-04 DIAGNOSIS — E785 Hyperlipidemia, unspecified: Secondary | ICD-10-CM | POA: Diagnosis not present

## 2023-11-04 DIAGNOSIS — L409 Psoriasis, unspecified: Secondary | ICD-10-CM | POA: Diagnosis not present

## 2023-11-05 DIAGNOSIS — H8113 Benign paroxysmal vertigo, bilateral: Secondary | ICD-10-CM | POA: Diagnosis not present

## 2023-11-05 DIAGNOSIS — R262 Difficulty in walking, not elsewhere classified: Secondary | ICD-10-CM | POA: Diagnosis not present

## 2023-11-07 DIAGNOSIS — L659 Nonscarring hair loss, unspecified: Secondary | ICD-10-CM | POA: Diagnosis not present

## 2023-11-07 DIAGNOSIS — R262 Difficulty in walking, not elsewhere classified: Secondary | ICD-10-CM | POA: Diagnosis not present

## 2023-11-07 DIAGNOSIS — Z139 Encounter for screening, unspecified: Secondary | ICD-10-CM | POA: Diagnosis not present

## 2023-11-07 DIAGNOSIS — H8113 Benign paroxysmal vertigo, bilateral: Secondary | ICD-10-CM | POA: Diagnosis not present

## 2023-11-21 DIAGNOSIS — R262 Difficulty in walking, not elsewhere classified: Secondary | ICD-10-CM | POA: Diagnosis not present

## 2023-11-21 DIAGNOSIS — H8113 Benign paroxysmal vertigo, bilateral: Secondary | ICD-10-CM | POA: Diagnosis not present

## 2023-11-25 DIAGNOSIS — E1169 Type 2 diabetes mellitus with other specified complication: Secondary | ICD-10-CM | POA: Diagnosis not present

## 2023-11-28 DIAGNOSIS — R262 Difficulty in walking, not elsewhere classified: Secondary | ICD-10-CM | POA: Diagnosis not present

## 2023-11-28 DIAGNOSIS — H8113 Benign paroxysmal vertigo, bilateral: Secondary | ICD-10-CM | POA: Diagnosis not present

## 2023-12-02 DIAGNOSIS — L658 Other specified nonscarring hair loss: Secondary | ICD-10-CM | POA: Diagnosis not present

## 2023-12-02 DIAGNOSIS — E1169 Type 2 diabetes mellitus with other specified complication: Secondary | ICD-10-CM | POA: Diagnosis not present

## 2023-12-02 DIAGNOSIS — E782 Mixed hyperlipidemia: Secondary | ICD-10-CM | POA: Diagnosis not present

## 2023-12-04 DIAGNOSIS — E785 Hyperlipidemia, unspecified: Secondary | ICD-10-CM | POA: Diagnosis not present

## 2023-12-04 DIAGNOSIS — L409 Psoriasis, unspecified: Secondary | ICD-10-CM | POA: Diagnosis not present

## 2023-12-12 DIAGNOSIS — H8113 Benign paroxysmal vertigo, bilateral: Secondary | ICD-10-CM | POA: Diagnosis not present

## 2023-12-12 DIAGNOSIS — R262 Difficulty in walking, not elsewhere classified: Secondary | ICD-10-CM | POA: Diagnosis not present

## 2024-01-04 DIAGNOSIS — E785 Hyperlipidemia, unspecified: Secondary | ICD-10-CM | POA: Diagnosis not present

## 2024-01-04 DIAGNOSIS — L409 Psoriasis, unspecified: Secondary | ICD-10-CM | POA: Diagnosis not present

## 2024-01-06 DIAGNOSIS — R110 Nausea: Secondary | ICD-10-CM | POA: Diagnosis not present

## 2024-01-17 ENCOUNTER — Other Ambulatory Visit (HOSPITAL_BASED_OUTPATIENT_CLINIC_OR_DEPARTMENT_OTHER): Payer: Self-pay

## 2024-01-19 DIAGNOSIS — Z7984 Long term (current) use of oral hypoglycemic drugs: Secondary | ICD-10-CM | POA: Diagnosis not present

## 2024-01-19 DIAGNOSIS — Z7985 Long-term (current) use of injectable non-insulin antidiabetic drugs: Secondary | ICD-10-CM | POA: Diagnosis not present

## 2024-01-19 DIAGNOSIS — L602 Onychogryphosis: Secondary | ICD-10-CM | POA: Diagnosis not present

## 2024-01-19 DIAGNOSIS — E119 Type 2 diabetes mellitus without complications: Secondary | ICD-10-CM | POA: Diagnosis not present

## 2024-02-03 DIAGNOSIS — S76019A Strain of muscle, fascia and tendon of unspecified hip, initial encounter: Secondary | ICD-10-CM | POA: Diagnosis not present

## 2024-02-03 DIAGNOSIS — L409 Psoriasis, unspecified: Secondary | ICD-10-CM | POA: Diagnosis not present

## 2024-02-03 DIAGNOSIS — E785 Hyperlipidemia, unspecified: Secondary | ICD-10-CM | POA: Diagnosis not present

## 2024-03-05 DIAGNOSIS — L409 Psoriasis, unspecified: Secondary | ICD-10-CM | POA: Diagnosis not present

## 2024-03-05 DIAGNOSIS — E785 Hyperlipidemia, unspecified: Secondary | ICD-10-CM | POA: Diagnosis not present

## 2024-03-16 DIAGNOSIS — E1169 Type 2 diabetes mellitus with other specified complication: Secondary | ICD-10-CM | POA: Diagnosis not present

## 2024-03-23 DIAGNOSIS — Z0000 Encounter for general adult medical examination without abnormal findings: Secondary | ICD-10-CM | POA: Diagnosis not present

## 2024-03-23 DIAGNOSIS — E785 Hyperlipidemia, unspecified: Secondary | ICD-10-CM | POA: Diagnosis not present

## 2024-03-23 DIAGNOSIS — E1159 Type 2 diabetes mellitus with other circulatory complications: Secondary | ICD-10-CM | POA: Diagnosis not present

## 2024-03-23 DIAGNOSIS — Z139 Encounter for screening, unspecified: Secondary | ICD-10-CM | POA: Diagnosis not present

## 2024-03-23 DIAGNOSIS — I152 Hypertension secondary to endocrine disorders: Secondary | ICD-10-CM | POA: Diagnosis not present

## 2024-03-23 DIAGNOSIS — M25519 Pain in unspecified shoulder: Secondary | ICD-10-CM | POA: Diagnosis not present

## 2024-03-23 DIAGNOSIS — Z136 Encounter for screening for cardiovascular disorders: Secondary | ICD-10-CM | POA: Diagnosis not present

## 2024-03-24 DIAGNOSIS — L578 Other skin changes due to chronic exposure to nonionizing radiation: Secondary | ICD-10-CM | POA: Diagnosis not present

## 2024-03-24 DIAGNOSIS — L820 Inflamed seborrheic keratosis: Secondary | ICD-10-CM | POA: Diagnosis not present

## 2024-03-24 DIAGNOSIS — L814 Other melanin hyperpigmentation: Secondary | ICD-10-CM | POA: Diagnosis not present

## 2024-03-24 DIAGNOSIS — L728 Other follicular cysts of the skin and subcutaneous tissue: Secondary | ICD-10-CM | POA: Diagnosis not present

## 2024-04-05 DIAGNOSIS — E785 Hyperlipidemia, unspecified: Secondary | ICD-10-CM | POA: Diagnosis not present

## 2024-04-05 DIAGNOSIS — L409 Psoriasis, unspecified: Secondary | ICD-10-CM | POA: Diagnosis not present

## 2024-04-13 DIAGNOSIS — H35371 Puckering of macula, right eye: Secondary | ICD-10-CM | POA: Diagnosis not present

## 2024-04-13 DIAGNOSIS — H25813 Combined forms of age-related cataract, bilateral: Secondary | ICD-10-CM | POA: Diagnosis not present

## 2024-04-15 DIAGNOSIS — M19011 Primary osteoarthritis, right shoulder: Secondary | ICD-10-CM | POA: Diagnosis not present

## 2024-04-15 DIAGNOSIS — M67911 Unspecified disorder of synovium and tendon, right shoulder: Secondary | ICD-10-CM | POA: Diagnosis not present

## 2024-04-20 DIAGNOSIS — M47816 Spondylosis without myelopathy or radiculopathy, lumbar region: Secondary | ICD-10-CM | POA: Diagnosis not present

## 2024-04-20 DIAGNOSIS — M5459 Other low back pain: Secondary | ICD-10-CM | POA: Diagnosis not present

## 2024-04-22 DIAGNOSIS — I10 Essential (primary) hypertension: Secondary | ICD-10-CM | POA: Diagnosis not present

## 2024-04-22 DIAGNOSIS — H25811 Combined forms of age-related cataract, right eye: Secondary | ICD-10-CM | POA: Diagnosis not present

## 2024-04-22 DIAGNOSIS — E1136 Type 2 diabetes mellitus with diabetic cataract: Secondary | ICD-10-CM | POA: Diagnosis not present

## 2024-04-22 DIAGNOSIS — H2511 Age-related nuclear cataract, right eye: Secondary | ICD-10-CM | POA: Diagnosis not present

## 2024-04-26 DIAGNOSIS — L602 Onychogryphosis: Secondary | ICD-10-CM | POA: Diagnosis not present

## 2024-04-26 DIAGNOSIS — Z7984 Long term (current) use of oral hypoglycemic drugs: Secondary | ICD-10-CM | POA: Diagnosis not present

## 2024-04-26 DIAGNOSIS — E119 Type 2 diabetes mellitus without complications: Secondary | ICD-10-CM | POA: Diagnosis not present

## 2024-04-26 DIAGNOSIS — Z7985 Long-term (current) use of injectable non-insulin antidiabetic drugs: Secondary | ICD-10-CM | POA: Diagnosis not present

## 2024-05-05 DIAGNOSIS — E785 Hyperlipidemia, unspecified: Secondary | ICD-10-CM | POA: Diagnosis not present

## 2024-05-05 DIAGNOSIS — L409 Psoriasis, unspecified: Secondary | ICD-10-CM | POA: Diagnosis not present

## 2024-05-13 DIAGNOSIS — E1136 Type 2 diabetes mellitus with diabetic cataract: Secondary | ICD-10-CM | POA: Diagnosis not present

## 2024-05-13 DIAGNOSIS — H25812 Combined forms of age-related cataract, left eye: Secondary | ICD-10-CM | POA: Diagnosis not present

## 2024-05-13 DIAGNOSIS — I10 Essential (primary) hypertension: Secondary | ICD-10-CM | POA: Diagnosis not present

## 2024-06-05 DIAGNOSIS — E785 Hyperlipidemia, unspecified: Secondary | ICD-10-CM | POA: Diagnosis not present

## 2024-06-05 DIAGNOSIS — L409 Psoriasis, unspecified: Secondary | ICD-10-CM | POA: Diagnosis not present

## 2024-06-17 ENCOUNTER — Other Ambulatory Visit (HOSPITAL_BASED_OUTPATIENT_CLINIC_OR_DEPARTMENT_OTHER): Payer: Self-pay | Admitting: Family Medicine

## 2024-06-17 DIAGNOSIS — Z1231 Encounter for screening mammogram for malignant neoplasm of breast: Secondary | ICD-10-CM

## 2024-06-17 DIAGNOSIS — Z780 Asymptomatic menopausal state: Secondary | ICD-10-CM

## 2024-07-20 ENCOUNTER — Ambulatory Visit (INDEPENDENT_AMBULATORY_CARE_PROVIDER_SITE_OTHER)
Admission: RE | Admit: 2024-07-20 | Discharge: 2024-07-20 | Disposition: A | Discharge status: Home / Self Care | Source: Ambulatory Visit | Attending: Family Medicine | Admitting: Family Medicine

## 2024-07-20 ENCOUNTER — Encounter (HOSPITAL_BASED_OUTPATIENT_CLINIC_OR_DEPARTMENT_OTHER): Payer: Self-pay

## 2024-07-20 ENCOUNTER — Ambulatory Visit (HOSPITAL_BASED_OUTPATIENT_CLINIC_OR_DEPARTMENT_OTHER)
Admission: RE | Admit: 2024-07-20 | Discharge: 2024-07-20 | Disposition: A | Source: Ambulatory Visit | Attending: Family Medicine | Admitting: Family Medicine

## 2024-07-20 DIAGNOSIS — Z780 Asymptomatic menopausal state: Secondary | ICD-10-CM

## 2024-07-20 DIAGNOSIS — Z1231 Encounter for screening mammogram for malignant neoplasm of breast: Secondary | ICD-10-CM | POA: Diagnosis not present

## 2024-07-20 DIAGNOSIS — E2839 Other primary ovarian failure: Secondary | ICD-10-CM | POA: Diagnosis not present
